# Patient Record
Sex: Female | Born: 1965 | Race: Black or African American | Hispanic: No | Marital: Single | Smoking: Never smoker
Health system: Southern US, Community
[De-identification: ages and names within clinical notes are randomized; demographics above are authoritative.]

## PROBLEM LIST (undated history)

## (undated) DIAGNOSIS — E039 Hypothyroidism, unspecified: Secondary | ICD-10-CM

---

## 1998-04-07 ENCOUNTER — Other Ambulatory Visit: Admission: RE | Admit: 1998-04-07 | Discharge: 1998-04-07 | Payer: Self-pay | Admitting: Obstetrics & Gynecology

## 1999-05-24 ENCOUNTER — Other Ambulatory Visit: Admission: RE | Admit: 1999-05-24 | Discharge: 1999-05-24 | Payer: Self-pay | Admitting: Obstetrics & Gynecology

## 1999-12-30 ENCOUNTER — Emergency Department (HOSPITAL_COMMUNITY): Admission: EM | Admit: 1999-12-30 | Discharge: 1999-12-30 | Payer: Self-pay | Admitting: Emergency Medicine

## 1999-12-30 ENCOUNTER — Encounter: Payer: Self-pay | Admitting: Emergency Medicine

## 2000-05-07 ENCOUNTER — Other Ambulatory Visit: Admission: RE | Admit: 2000-05-07 | Discharge: 2000-05-07 | Payer: Self-pay | Admitting: Obstetrics & Gynecology

## 2000-06-11 ENCOUNTER — Encounter (INDEPENDENT_AMBULATORY_CARE_PROVIDER_SITE_OTHER): Payer: Self-pay | Admitting: Specialist

## 2000-06-11 ENCOUNTER — Other Ambulatory Visit: Admission: RE | Admit: 2000-06-11 | Discharge: 2000-06-11 | Payer: Self-pay | Admitting: Obstetrics & Gynecology

## 2000-10-23 ENCOUNTER — Other Ambulatory Visit: Admission: RE | Admit: 2000-10-23 | Discharge: 2000-10-23 | Payer: Self-pay | Admitting: Obstetrics & Gynecology

## 2001-10-21 ENCOUNTER — Other Ambulatory Visit: Admission: RE | Admit: 2001-10-21 | Discharge: 2001-10-21 | Payer: Self-pay | Admitting: Obstetrics & Gynecology

## 2002-10-24 ENCOUNTER — Other Ambulatory Visit: Admission: RE | Admit: 2002-10-24 | Discharge: 2002-10-24 | Payer: Self-pay | Admitting: Obstetrics & Gynecology

## 2003-10-26 ENCOUNTER — Other Ambulatory Visit: Admission: RE | Admit: 2003-10-26 | Discharge: 2003-10-26 | Payer: Self-pay | Admitting: Obstetrics & Gynecology

## 2004-11-23 ENCOUNTER — Other Ambulatory Visit: Admission: RE | Admit: 2004-11-23 | Discharge: 2004-11-23 | Payer: Self-pay | Admitting: Obstetrics & Gynecology

## 2005-12-01 ENCOUNTER — Other Ambulatory Visit: Admission: RE | Admit: 2005-12-01 | Discharge: 2005-12-01 | Payer: Self-pay | Admitting: Obstetrics & Gynecology

## 2010-11-30 ENCOUNTER — Encounter
Admission: RE | Admit: 2010-11-30 | Discharge: 2010-11-30 | Payer: Self-pay | Source: Home / Self Care | Attending: Obstetrics & Gynecology | Admitting: Obstetrics & Gynecology

## 2010-12-14 ENCOUNTER — Encounter: Payer: Self-pay | Admitting: Obstetrics & Gynecology

## 2012-05-08 ENCOUNTER — Encounter (HOSPITAL_COMMUNITY): Payer: Self-pay | Admitting: Emergency Medicine

## 2012-05-08 ENCOUNTER — Emergency Department (HOSPITAL_COMMUNITY)
Admission: EM | Admit: 2012-05-08 | Discharge: 2012-05-08 | Disposition: A | Discharge status: Home / Self Care | Payer: BC Managed Care – PPO | Source: Home / Self Care | Attending: Emergency Medicine | Admitting: Emergency Medicine

## 2012-05-08 DIAGNOSIS — M6749 Ganglion, multiple sites: Secondary | ICD-10-CM

## 2012-05-08 MED ORDER — MELOXICAM 15 MG PO TABS: 15.0000 mg | ORAL_TABLET | Freq: Every day | ORAL | Status: AC

## 2012-05-08 NOTE — ED Provider Notes (Signed)
History     CSN: [id number]  Arrival date & time 05/08/12  1328   First MD Initiated Contact with Patient 05/08/12 1415      Chief Complaint  Patient presents with  . Foot Pain    (Consider location/radiation/quality/duration/timing/severity/associated sxs/prior treatment) HPI Comments: Patient states that she is wearing a tight pair shoes for several days, and states that both of her feet were hurting. States she has no wearing his, but notes a nontender, firm mass on the dorsum of the left foot. It is not changed in size since it appeared. No nausea, vomiting, fevers, redness. No recent or remote history of injury to her foot. Pain is worse with elevation for prolonged periods of time, and cold temperatures. No alleviating factors. She's been taking some Tylenol without improvement. She is nondiabetic or smoker.  ROS as noted in HPI. All other ROS negative.   Patient is a 46 y.o. female presenting with lower extremity pain. The history is provided by the patient.  Foot Pain This is a new problem. The current episode started more than 1 week ago. The problem has not changed since onset.Nothing aggravates the symptoms. Nothing relieves the symptoms. She has tried a cold compress for the symptoms.    History reviewed. No pertinent past medical history.  Past Surgical History  Procedure Date  . Cesarean section     History reviewed. No pertinent family history.  History  Substance Use Topics  . Smoking status: Never Smoker   . Smokeless tobacco: Not on file  . Alcohol Use: No    OB History    Grav Para Term Preterm Abortions TAB SAB Ect Mult Living                  Review of Systems  Allergies  Review of patient's allergies indicates no known allergies.  Home Medications   Current Outpatient Rx  Name Route Sig Dispense Refill  . ACETAMINOPHEN 500 MG PO TABS Oral Take 500 mg by mouth every 6 (six) hours as needed.    . MULTIVITAMINS PO CAPS Oral Take 1 capsule by  mouth daily.    Marland Kitchen OVER THE COUNTER MEDICATION  enzymes    . MELOXICAM 15 MG PO TABS Oral Take 1 tablet (15 mg total) by mouth daily. 14 tablet 0    BP 125/66  Pulse 88  Temp 98.5 F (36.9 C) (Oral)  Resp 20  SpO2 100%  LMP 04/11/2012  Physical Exam  Nursing note and vitals reviewed. Constitutional: She is oriented to person, place, and time. She appears well-developed and well-nourished. No distress.  HENT:  Head: Normocephalic and atraumatic.  Eyes: Conjunctivae and EOM are normal.  Neck: Normal range of motion.  Cardiovascular: Normal rate.   Pulmonary/Chest: Effort normal.  Abdominal: She exhibits no distension.  Musculoskeletal: Normal range of motion.       Left foot: She exhibits swelling. She exhibits no tenderness, no bony tenderness, normal capillary refill and no crepitus.       1.5 x 1.5 cm cm mobile nontender mass dorsum of foot c/w ganglion cyst   Neurological: She is alert and oriented to person, place, and time. Coordination normal.  Skin: Skin is warm and dry.  Psychiatric: She has a normal mood and affect. Her behavior is normal. Judgment and thought content normal.    ED Course  Procedures (including critical care time)  Labs Reviewed - No data to display No results found.   1. Other ganglion and cyst  of synovium, tendon, and bursa     MDM  H&P most consistent with a ganglion cyst on the dorsum of the left foot. No History of trauma, no bony tenderness, deferring x-ray. Think that patient would benefit more from ultrasound, CT, MRI.: home with Meloxicam. Referring to [facility], Dr. due to, orthopedist on call, or [facility] for followup as needed.  Luiz Blare, MD 05/08/12 [phone]

## 2012-05-08 NOTE — Discharge Instructions (Signed)
Take the medication as written. Take 1 gram of tylenol up to 4 times a day as needed for pain and fever. This with the meloxicam is an effective combination for pain. . Do not exceed 4 grams of tylenol a day from all sources. Return if you get worse, have a  fever >100.4, or for any concerns.   Go to www.goodrx.com to look up your medications. This will give you a list of where you can find your prescriptions at the most affordable prices.   

## 2012-05-08 NOTE — ED Notes (Signed)
Reports 2 week history of soreness to the top of left foot and has had swelling.  Patient reports aching when foot is elevated. Denies pain with weight bearing ambulation.  Bump feels firm, but able to move enlarged area with causing pain to the patient.  Pulses palpable.  Moves toes without difficulty.  Denies injury

## 2013-02-03 ENCOUNTER — Emergency Department (HOSPITAL_COMMUNITY)
Admission: EM | Admit: 2013-02-03 | Discharge: 2013-02-03 | Disposition: A | Discharge status: Home / Self Care | Payer: BC Managed Care – PPO | Source: Home / Self Care | Attending: Emergency Medicine | Admitting: Emergency Medicine

## 2013-02-03 ENCOUNTER — Encounter (HOSPITAL_COMMUNITY): Payer: Self-pay

## 2013-02-03 DIAGNOSIS — Z23 Encounter for immunization: Secondary | ICD-10-CM

## 2013-02-03 DIAGNOSIS — S0101XA Laceration without foreign body of scalp, initial encounter: Secondary | ICD-10-CM

## 2013-02-03 DIAGNOSIS — S0100XA Unspecified open wound of scalp, initial encounter: Secondary | ICD-10-CM

## 2013-02-03 MED ORDER — TETANUS-DIPHTH-ACELL PERTUSSIS 5-2.5-18.5 LF-MCG/0.5 IM SUSP
INTRAMUSCULAR | Status: AC
  Filled 2013-02-03: qty 0.5

## 2013-02-03 MED ORDER — TETANUS-DIPHTH-ACELL PERTUSSIS 5-2.5-18.5 LF-MCG/0.5 IM SUSP
0.5000 mL | Freq: Once | INTRAMUSCULAR | Status: AC
  Administered 2013-02-03: 0.5 mL via INTRAMUSCULAR

## 2013-02-03 MED ORDER — BACITRACIN ZINC 500 UNIT/GM EX OINT: TOPICAL_OINTMENT | Freq: Three times a day (TID) | CUTANEOUS | Status: AC

## 2013-02-03 NOTE — ED Notes (Signed)
Was asked to eval this patient on arrival by front office staff. Pt reportedly was struck by picture frame that fell from wall when she was laying on bed just PTA. Laceration aprox 2 cm, superficial , present, no bleeding at present, denies other injuries. Pressure dressing aplied on saclp until further care, pt to waiting area to wiat for bed placement

## 2013-02-03 NOTE — ED Provider Notes (Signed)
History     CSN: [id number]  Arrival date & time 02/03/13  1558   First MD Initiated Contact with Patient 02/03/13 1609      No chief complaint on file.   (Consider location/radiation/quality/duration/timing/severity/associated sxs/prior treatment) HPI Comments: Patient presents urgent care after having sustained a laceration to his cough. Patient reports she was struck by a picture frame fell down from a wall where she was lying down on her bed. Bled some from the scalp laceration that she applied ice where her bleeding subsequently stopped. Patient denies any loss of consciousness or severe or moderate headache. Mild soreness to the area of the infected wound. Denies any nausea vomiting dizziness or headache. She suspects she got a tetanus shot in the last 10 years her last  Patient is a 47 y.o. female presenting with head injury. The history is provided by the patient.  Head Injury Location:  R parietal Time since incident:  1 hour Pain details:    Quality:  Aching   Severity:  Mild   Timing:  Constant   Progression:  Unchanged Relieved by:  Ice Worsened by:  Nothing tried Associated symptoms: no blurred vision, no disorientation, no headaches, no loss of consciousness, no neck pain and no numbness   Risk factors: no alcohol use, not elderly and no occupational exposure     No past medical history on file.  Past Surgical History  Procedure Laterality Date  . Cesarean section      No family history on file.  History  Substance Use Topics  . Smoking status: Never Smoker   . Smokeless tobacco: Not on file  . Alcohol Use: No    OB History   Grav Para Term Preterm Abortions TAB SAB Ect Mult Living                  Review of Systems  Constitutional: Negative for chills, activity change and appetite change.  HENT: Negative for neck pain.   Eyes: Negative for blurred vision and visual disturbance.  Skin: Positive for wound. Negative for rash.  Neurological:  Negative for dizziness, loss of consciousness, numbness and headaches.    Allergies  Review of patient's allergies indicates no known allergies.  Home Medications   Current Outpatient Rx  Name  Route  Sig  Dispense  Refill  . acetaminophen (TYLENOL) 500 MG tablet   Oral   Take 500 mg by mouth every 6 (six) hours as needed.         . bacitracin ointment   Topical   Apply topically 3 (three) times daily.   120 g   0   . meloxicam (MOBIC) 15 MG tablet   Oral   Take 1 tablet (15 mg total) by mouth daily.   14 tablet   0   . Multiple Vitamin (MULTIVITAMIN) capsule   Oral   Take 1 capsule by mouth daily.         Marland Kitchen OVER THE COUNTER MEDICATION      enzymes           BP 145/62  Pulse 86  Temp(Src) 98.6 F (37 C) (Oral)  Resp 16  SpO2 98%  Physical Exam  Nursing note and vitals reviewed. Constitutional: She is oriented to person, place, and time. She appears well-developed and well-nourished.  HENT:  Head: Normocephalic.    Pulmonary/Chest: Effort normal and breath sounds normal.  Neurological: She is alert and oriented to person, place, and time.  Skin: No rash noted.  No erythema.    ED Course  Procedures (including critical care time)  Labs Reviewed - No data to display No results found.   1. Scalp laceration, initial encounter     Not candidate for sutures or staples- irrigated cleaned wound with hibiclens with topical abx application  MDM  Uncomplicated scalp wound- wound care - discussed symptoms or wound changes that will require of further evaluation        Jimmie Molly, MD 02/03/13 1739

## 2015-09-24 ENCOUNTER — Ambulatory Visit (INDEPENDENT_AMBULATORY_CARE_PROVIDER_SITE_OTHER): Payer: BLUE CROSS/BLUE SHIELD | Admitting: Family Medicine

## 2015-09-24 ENCOUNTER — Encounter: Payer: Self-pay | Admitting: Family Medicine

## 2015-09-24 VITALS — BP 114/80 | HR 90 | Ht 64.25 in | Wt 212.0 lb

## 2015-09-24 DIAGNOSIS — E669 Obesity, unspecified: Secondary | ICD-10-CM

## 2015-09-24 DIAGNOSIS — Z1239 Encounter for other screening for malignant neoplasm of breast: Secondary | ICD-10-CM

## 2015-09-24 DIAGNOSIS — Z7189 Other specified counseling: Secondary | ICD-10-CM

## 2015-09-24 DIAGNOSIS — Z1211 Encounter for screening for malignant neoplasm of colon: Secondary | ICD-10-CM | POA: Diagnosis not present

## 2015-09-24 DIAGNOSIS — Z7689 Persons encountering health services in other specified circumstances: Secondary | ICD-10-CM

## 2015-09-24 NOTE — Patient Instructions (Signed)
Vitamin D 800 or 1000 IU  [facility] will call you to set up colonoscopy.  I will put in order for mammogram. You call breast center to set up appointment.

## 2015-09-24 NOTE — Progress Notes (Signed)
   Subjective:    Patient ID: Brooke Rice, female    DOB: 11-Dec-1965, 49 y.o.   MRN: [id number]  HPI Chief Complaint  Patient presents with  . New Pt    no problems. just wanted to get established. declines flu shot.    She is new to the practice and here to establish primary care. In past she has been going to [facility] practice with Anna GenreStephanie Powers, NP Other providers: Dr. [facility]   PMH: No  Surgeries: C-section  LMP: November 2015, she thinks she has finally reached menopause.  Social history: denies smoking, alcohol, drug use. Happily divorced. One 6119 old child.  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   Vegetarian for past 5 months.  Exercises- treadmill and weight lifting  Colonoscopy- has not had one, would like referral  Mammogram- 2 years ago- requests referral, Breast center in past.  Pap- last one in 2013 and normal per patient  Flu shot- has never gotten one and refuses.  Tdap- UTD 2014   Reviewed allergies, medications, past medical, surgical, social history.  Review of Systems Pertinent positives and negatives in the history of present illness.    Objective:   Physical Exam  Alert and oriented and in no distress. Not otherwise examined. BP 114/80 mmHg  Pulse 90  Ht 5' 4.25" (1.632 m)  Wt 212 lb (96.163 kg)  BMI 36.11 kg/m2  SpO2 98%  LMP 04/11/2012     Assessment & Plan:  Encounter to establish care  Obesity (BMI 30-39.9)  Special screening for malignant neoplasms, colon - Plan: Ambulatory referral to Gastroenterology  Screening for breast cancer - Plan: MM DIGITAL SCREENING BILATERAL  Congratulated her on eating healthy and exercising. Discussed she should get the recommended 150 minutes of physical activity per week. Encouraged her to continue on weight loss journey. Will refer her for colonoscopy and mammogram. Discussed that since she has had normal pap smears in past that we can do her future paps here and then send her to GYN if  needed. She will return as needed and schedule for physical and fasting labs when she is due. Need to get medical records. Refused flu shot, even though I highly recommended it.

## 2015-12-14 ENCOUNTER — Other Ambulatory Visit: Payer: Self-pay | Admitting: Family Medicine

## 2015-12-14 DIAGNOSIS — Z1231 Encounter for screening mammogram for malignant neoplasm of breast: Secondary | ICD-10-CM

## 2016-03-24 DIAGNOSIS — R0602 Shortness of breath: Secondary | ICD-10-CM | POA: Diagnosis not present

## 2016-03-24 DIAGNOSIS — J018 Other acute sinusitis: Secondary | ICD-10-CM | POA: Diagnosis not present

## 2016-03-28 DIAGNOSIS — R748 Abnormal levels of other serum enzymes: Secondary | ICD-10-CM | POA: Diagnosis not present

## 2016-03-28 DIAGNOSIS — Z1211 Encounter for screening for malignant neoplasm of colon: Secondary | ICD-10-CM | POA: Diagnosis not present

## 2016-04-12 DIAGNOSIS — B029 Zoster without complications: Secondary | ICD-10-CM | POA: Diagnosis not present

## 2016-05-26 DIAGNOSIS — K6389 Other specified diseases of intestine: Secondary | ICD-10-CM | POA: Diagnosis not present

## 2016-05-26 DIAGNOSIS — D124 Benign neoplasm of descending colon: Secondary | ICD-10-CM | POA: Diagnosis not present

## 2016-05-26 DIAGNOSIS — Z1211 Encounter for screening for malignant neoplasm of colon: Secondary | ICD-10-CM | POA: Diagnosis not present

## 2016-05-26 DIAGNOSIS — K635 Polyp of colon: Secondary | ICD-10-CM | POA: Diagnosis not present

## 2016-06-06 DIAGNOSIS — Z0000 Encounter for general adult medical examination without abnormal findings: Secondary | ICD-10-CM | POA: Diagnosis not present

## 2016-06-06 DIAGNOSIS — Z1211 Encounter for screening for malignant neoplasm of colon: Secondary | ICD-10-CM | POA: Diagnosis not present

## 2016-06-06 DIAGNOSIS — Z6835 Body mass index (BMI) 35.0-35.9, adult: Secondary | ICD-10-CM | POA: Diagnosis not present

## 2016-11-16 ENCOUNTER — Encounter (HOSPITAL_COMMUNITY): Payer: Self-pay | Admitting: Emergency Medicine

## 2016-11-16 ENCOUNTER — Emergency Department (HOSPITAL_COMMUNITY)
Admission: EM | Admit: 2016-11-16 | Discharge: 2016-11-16 | Disposition: A | Discharge status: Home / Self Care | Payer: Self-pay | Attending: Emergency Medicine | Admitting: Emergency Medicine

## 2016-11-16 DIAGNOSIS — Z79899 Other long term (current) drug therapy: Secondary | ICD-10-CM | POA: Insufficient documentation

## 2016-11-16 DIAGNOSIS — R404 Transient alteration of awareness: Secondary | ICD-10-CM | POA: Diagnosis not present

## 2016-11-16 DIAGNOSIS — R42 Dizziness and giddiness: Secondary | ICD-10-CM | POA: Insufficient documentation

## 2016-11-16 DIAGNOSIS — Z5321 Procedure and treatment not carried out due to patient leaving prior to being seen by health care provider: Secondary | ICD-10-CM | POA: Insufficient documentation

## 2016-11-16 LAB — CBC
HEMATOCRIT: 37.6 % (ref 36.0–46.0)
HEMOGLOBIN: 12.4 g/dL (ref 12.0–15.0)
MCH: 30.9 pg (ref 26.0–34.0)
MCHC: 33 g/dL (ref 30.0–36.0)
MCV: 93.8 fL (ref 78.0–100.0)
Platelets: 262 10*3/uL (ref 150–400)
RBC: 4.01 MIL/uL (ref 3.87–5.11)
RDW: 12.2 % (ref 11.5–15.5)
WBC: 7.8 10*3/uL (ref 4.0–10.5)

## 2016-11-16 LAB — BASIC METABOLIC PANEL
ANION GAP: 7 (ref 5–15)
BUN: 9 mg/dL (ref 6–20)
CHLORIDE: 107 mmol/L (ref 101–111)
CO2: 25 mmol/L (ref 22–32)
Calcium: 9.3 mg/dL (ref 8.9–10.3)
Creatinine, Ser: 0.89 mg/dL (ref 0.44–1.00)
GFR calc Af Amer: >60 mL/min (ref >60)
GFR calc non Af Amer: >60 mL/min (ref >60)
Glucose, Bld: 119 mg/dL — ABNORMAL HIGH (ref 65–99)
POTASSIUM: 4 mmol/L (ref 3.5–5.1)
SODIUM: 139 mmol/L (ref 135–145)

## 2016-11-16 NOTE — ED Triage Notes (Addendum)
Per EMS: pt became dizzy while driving after shopping today; pt sts things were spinning around; improved at present; IV 20g L hand; pt sts right sided HA x 2 weeks

## 2016-11-16 NOTE — ED Notes (Signed)
Pt to nurse first desk reporting she is wanting to leave. IV removed. Pt ambulatory, a&o X4. No distress noted.

## 2017-02-28 DIAGNOSIS — Z6835 Body mass index (BMI) 35.0-35.9, adult: Secondary | ICD-10-CM | POA: Diagnosis not present

## 2017-02-28 DIAGNOSIS — Z01419 Encounter for gynecological examination (general) (routine) without abnormal findings: Secondary | ICD-10-CM | POA: Diagnosis not present

## 2017-02-28 DIAGNOSIS — Z1231 Encounter for screening mammogram for malignant neoplasm of breast: Secondary | ICD-10-CM | POA: Diagnosis not present

## 2017-12-24 DIAGNOSIS — H5213 Myopia, bilateral: Secondary | ICD-10-CM | POA: Diagnosis not present

## 2018-01-21 DIAGNOSIS — J069 Acute upper respiratory infection, unspecified: Secondary | ICD-10-CM | POA: Diagnosis not present

## 2018-01-21 DIAGNOSIS — Z0000 Encounter for general adult medical examination without abnormal findings: Secondary | ICD-10-CM | POA: Diagnosis not present

## 2018-01-21 DIAGNOSIS — Z1231 Encounter for screening mammogram for malignant neoplasm of breast: Secondary | ICD-10-CM | POA: Diagnosis not present

## 2018-01-21 DIAGNOSIS — R42 Dizziness and giddiness: Secondary | ICD-10-CM | POA: Diagnosis not present

## 2018-11-18 ENCOUNTER — Other Ambulatory Visit: Payer: Self-pay | Admitting: Internal Medicine

## 2018-11-18 DIAGNOSIS — R42 Dizziness and giddiness: Secondary | ICD-10-CM | POA: Diagnosis not present

## 2018-11-18 DIAGNOSIS — R29898 Other symptoms and signs involving the musculoskeletal system: Secondary | ICD-10-CM | POA: Diagnosis not present

## 2018-11-18 DIAGNOSIS — Z1231 Encounter for screening mammogram for malignant neoplasm of breast: Secondary | ICD-10-CM

## 2018-11-18 DIAGNOSIS — Z1239 Encounter for other screening for malignant neoplasm of breast: Secondary | ICD-10-CM | POA: Diagnosis not present

## 2018-11-19 ENCOUNTER — Ambulatory Visit
Admission: RE | Admit: 2018-11-19 | Discharge: 2018-11-19 | Disposition: A | Discharge status: Home / Self Care | Payer: BLUE CROSS/BLUE SHIELD | Source: Ambulatory Visit | Attending: Internal Medicine | Admitting: Internal Medicine

## 2018-11-19 DIAGNOSIS — I6522 Occlusion and stenosis of left carotid artery: Secondary | ICD-10-CM | POA: Diagnosis not present

## 2018-11-19 DIAGNOSIS — R42 Dizziness and giddiness: Secondary | ICD-10-CM

## 2018-11-22 ENCOUNTER — Ambulatory Visit
Admission: RE | Admit: 2018-11-22 | Discharge: 2018-11-22 | Disposition: A | Discharge status: Home / Self Care | Payer: BLUE CROSS/BLUE SHIELD | Source: Ambulatory Visit | Attending: Internal Medicine | Admitting: Internal Medicine

## 2018-11-22 DIAGNOSIS — Z1231 Encounter for screening mammogram for malignant neoplasm of breast: Secondary | ICD-10-CM

## 2019-01-23 DIAGNOSIS — Z1211 Encounter for screening for malignant neoplasm of colon: Secondary | ICD-10-CM | POA: Diagnosis not present

## 2019-01-23 DIAGNOSIS — Z0000 Encounter for general adult medical examination without abnormal findings: Secondary | ICD-10-CM | POA: Diagnosis not present

## 2019-01-23 DIAGNOSIS — Z1239 Encounter for other screening for malignant neoplasm of breast: Secondary | ICD-10-CM | POA: Diagnosis not present

## 2019-01-23 DIAGNOSIS — E669 Obesity, unspecified: Secondary | ICD-10-CM | POA: Diagnosis not present

## 2020-05-28 ENCOUNTER — Other Ambulatory Visit: Payer: Self-pay | Admitting: Internal Medicine

## 2020-05-28 DIAGNOSIS — Z1231 Encounter for screening mammogram for malignant neoplasm of breast: Secondary | ICD-10-CM

## 2020-06-30 ENCOUNTER — Other Ambulatory Visit: Payer: Self-pay

## 2020-06-30 ENCOUNTER — Ambulatory Visit
Admission: RE | Admit: 2020-06-30 | Discharge: 2020-06-30 | Disposition: A | Discharge status: Home / Self Care | Payer: BLUE CROSS/BLUE SHIELD | Source: Ambulatory Visit | Attending: Internal Medicine | Admitting: Internal Medicine

## 2020-06-30 DIAGNOSIS — Z1231 Encounter for screening mammogram for malignant neoplasm of breast: Secondary | ICD-10-CM

## 2020-12-08 ENCOUNTER — Ambulatory Visit (HOSPITAL_COMMUNITY)
Admission: EM | Admit: 2020-12-08 | Discharge: 2020-12-08 | Disposition: A | Discharge status: Home / Self Care | Payer: Managed Care, Other (non HMO)

## 2020-12-08 ENCOUNTER — Encounter (HOSPITAL_COMMUNITY): Payer: Self-pay

## 2020-12-08 ENCOUNTER — Other Ambulatory Visit: Payer: Self-pay

## 2020-12-08 DIAGNOSIS — U071 COVID-19: Secondary | ICD-10-CM

## 2020-12-08 DIAGNOSIS — R059 Cough, unspecified: Secondary | ICD-10-CM | POA: Diagnosis not present

## 2020-12-08 NOTE — ED Triage Notes (Signed)
Pt presents for note to return to work on Friday 12/10/2020 after recovering from covid; pt tested positive on 11/30/2020 , pt states she is no longer symptomatic.

## 2020-12-08 NOTE — ED Provider Notes (Addendum)
[facility]    CSN: [id number] Arrival date & time: 12/08/20  0818      History   Chief Complaint Chief Complaint  Patient presents with   Work Note Post Covid     HPI Brooke Rice is a 55 y.o. female.   HPI   COVID-19: According to patient she tested positive for COVID-19 on 11/30/2020.  She presents today to ensure that she is safe to return to work with an estimated return to work date of Friday, 12/10/2020.  She states that symptoms included headache, cough, mild nausea and vomiting, mild diarrhea, and a fever with a T-max of 102 Fahrenheit.  She states all of her symptoms have resolved and she is feeling well. NO fever within 24 hours. She denies any chest pain, shortness of breath, fatigue.  She does state that her blood pressure is likely a little bit elevated today as her mom was hospitalized for COVID-19.  Patient has not been vaccinated against COVID-19 but asks about when she should consider this in the future.   History reviewed. No pertinent past medical history.  There are no problems to display for this patient.   Past Surgical History:  Procedure Laterality Date   CESAREAN SECTION      OB History   No obstetric history on file.      Home Medications    Prior to Admission medications   Medication Sig Start Date End Date Taking? Authorizing Provider  acetaminophen (TYLENOL) 500 MG tablet Take 500 mg by mouth every 6 (six) hours as needed.    [provider]  Multiple Vitamin (MULTIVITAMIN) capsule Take 1 capsule by mouth daily.    [provider]  OVER THE COUNTER MEDICATION enzymes    [provider]    Family History Family History  Problem Relation Age of Onset   Breast cancer Neg Hx     Social History Social History   Tobacco Use   Smoking status: Never Smoker  Substance Use Topics   Alcohol use: No   Drug use: No     Allergies   Patient has no known allergies.   Review of  Systems Review of Systems  See above in HPI Physical Exam Triage Vital Signs ED Triage Vitals  Enc Vitals Group     BP 12/08/20 0853 (!) 146/72     Pulse Rate 12/08/20 0853 100     Resp 12/08/20 0853 18     Temp 12/08/20 0853 98.4 F (36.9 C)     Temp Source 12/08/20 0853 Oral     SpO2 12/08/20 0853 99 %     Weight --      Height --      Head Circumference --      Peak Flow --      Pain Score 12/08/20 0852 0     Pain Loc --      Pain Edu? --      Excl. in GC? --    No data found.  Updated Vital Signs BP (!) 146/72 (BP Location: Right Arm)    Pulse 100    Temp 98.4 F (36.9 C) (Oral)    Resp 18    LMP 04/11/2012    SpO2 99%   Physical Exam Vitals and nursing note reviewed.  Constitutional:      General: She is not in acute distress.    Appearance: Normal appearance. She is not ill-appearing, toxic-appearing or diaphoretic.  HENT:     Head:  Normocephalic.     Nose: Nose normal. No congestion or rhinorrhea.     Mouth/Throat:     Mouth: Mucous membranes are moist.     Pharynx: No oropharyngeal exudate or posterior oropharyngeal erythema.  Eyes:     Conjunctiva/sclera: Conjunctivae normal.     Pupils: Pupils are equal, round, and reactive to light.  Cardiovascular:     Rate and Rhythm: Normal rate and regular rhythm.     Pulses: Normal pulses.     Heart sounds: Normal heart sounds. No murmur heard. No gallop.   Pulmonary:     Effort: Pulmonary effort is normal. No respiratory distress.     Breath sounds: Normal breath sounds. No stridor. No wheezing, rhonchi or rales.  Chest:     Chest wall: No tenderness.  Musculoskeletal:     Cervical back: Normal range of motion and neck supple.  Lymphadenopathy:     Cervical: No cervical adenopathy.  Neurological:     Mental Status: She is alert.      UC Treatments / Results  Labs (all labs ordered are listed, but only abnormal results are displayed) Labs Reviewed - No data to display  Radiology No results  found.  Procedures Procedures (including critical care time)  Medications Ordered in UC Medications - No data to display  Initial Impression / Assessment and Plan / UC Course  I have reviewed the triage vital signs and the nursing notes.  Pertinent labs & imaging results that were available during my care of the patient were reviewed by me and considered in my medical decision making (see chart for details).     Patient appears to be resolving well from her illness.  We discussed red flag symptoms and the importance of continuing to rest and hydrate along with a healthy balanced diet.  She is cleared to return to work at this time.  We also discussed the COVID-19 vaccination including the recommendation for her to start her vaccination program in 1 month from her COVID-19 illness.   Final Clinical Impressions(s) / UC Diagnoses   Final diagnoses:  None   Discharge Instructions   None    ED Prescriptions    None     PDMP not reviewed this encounter.   Rushie Chestnut, PA-C 12/08/20 0951    Rushie Chestnut, PA-C 12/08/20 [phone]

## 2021-01-28 ENCOUNTER — Emergency Department (HOSPITAL_COMMUNITY): Payer: Managed Care, Other (non HMO)

## 2021-01-28 ENCOUNTER — Emergency Department (HOSPITAL_COMMUNITY)
Admission: EM | Admit: 2021-01-28 | Discharge: 2021-01-28 | Disposition: A | Discharge status: Home / Self Care | Payer: Managed Care, Other (non HMO) | Attending: Emergency Medicine | Admitting: Emergency Medicine

## 2021-01-28 ENCOUNTER — Encounter (HOSPITAL_COMMUNITY): Payer: Self-pay | Admitting: Emergency Medicine

## 2021-01-28 DIAGNOSIS — R519 Headache, unspecified: Secondary | ICD-10-CM | POA: Insufficient documentation

## 2021-01-28 DIAGNOSIS — R42 Dizziness and giddiness: Secondary | ICD-10-CM | POA: Insufficient documentation

## 2021-01-28 DIAGNOSIS — I10 Essential (primary) hypertension: Secondary | ICD-10-CM | POA: Insufficient documentation

## 2021-01-28 DIAGNOSIS — R079 Chest pain, unspecified: Secondary | ICD-10-CM | POA: Insufficient documentation

## 2021-01-28 DIAGNOSIS — Z5321 Procedure and treatment not carried out due to patient leaving prior to being seen by health care provider: Secondary | ICD-10-CM | POA: Diagnosis not present

## 2021-01-28 LAB — CBC
HCT: 37.5 % (ref 36.0–46.0)
Hemoglobin: 12.2 g/dL (ref 12.0–15.0)
MCH: 31.3 pg (ref 26.0–34.0)
MCHC: 32.5 g/dL (ref 30.0–36.0)
MCV: 96.2 fL (ref 80.0–100.0)
Platelets: 252 10*3/uL (ref 150–400)
RBC: 3.9 MIL/uL (ref 3.87–5.11)
RDW: 12.6 % (ref 11.5–15.5)
WBC: 9.2 10*3/uL (ref 4.0–10.5)
nRBC: 0 % (ref 0.0–0.2)

## 2021-01-28 LAB — BASIC METABOLIC PANEL
Anion gap: 7 (ref 5–15)
BUN: 9 mg/dL (ref 6–20)
CO2: 25 mmol/L (ref 22–32)
Calcium: 9.2 mg/dL (ref 8.9–10.3)
Chloride: 103 mmol/L (ref 98–111)
Creatinine, Ser: 0.93 mg/dL (ref 0.44–1.00)
GFR, Estimated: >60 mL/min (ref >60)
Glucose, Bld: 105 mg/dL — ABNORMAL HIGH (ref 70–99)
Potassium: 3.9 mmol/L (ref 3.5–5.1)
Sodium: 135 mmol/L (ref 135–145)

## 2021-01-28 LAB — TROPONIN I (HIGH SENSITIVITY): Troponin I (High Sensitivity): 4 ng/L (ref <18)

## 2021-01-28 NOTE — ED Triage Notes (Signed)
Pt. Stated, I started having a headache and some chest pain this morning around 1100am . I went to [facility] and took my BP and it was 141.. All of my symptoms have gone away. I just wanted to get checked out.

## 2021-01-28 NOTE — ED Triage Notes (Signed)
EMS stated, here for dizziness and hypertension. Having dizziness and headache started today.

## 2021-01-28 NOTE — ED Notes (Signed)
Called pts name 3x for updated VS with no response.  

## 2021-01-29 ENCOUNTER — Encounter (HOSPITAL_COMMUNITY): Payer: Self-pay | Admitting: Emergency Medicine

## 2021-01-29 ENCOUNTER — Other Ambulatory Visit: Payer: Self-pay

## 2021-01-29 ENCOUNTER — Emergency Department (HOSPITAL_COMMUNITY)
Admission: EM | Admit: 2021-01-29 | Discharge: 2021-01-29 | Disposition: A | Discharge status: Home / Self Care | Payer: Managed Care, Other (non HMO) | Attending: Emergency Medicine | Admitting: Emergency Medicine

## 2021-01-29 DIAGNOSIS — E875 Hyperkalemia: Secondary | ICD-10-CM | POA: Insufficient documentation

## 2021-01-29 DIAGNOSIS — I10 Essential (primary) hypertension: Secondary | ICD-10-CM | POA: Insufficient documentation

## 2021-01-29 DIAGNOSIS — R030 Elevated blood-pressure reading, without diagnosis of hypertension: Secondary | ICD-10-CM | POA: Diagnosis present

## 2021-01-29 LAB — I-STAT CHEM 8, ED
BUN: 10 mg/dL (ref 6–20)
Calcium, Ion: 1.14 mmol/L — ABNORMAL LOW (ref 1.15–1.40)
Chloride: 107 mmol/L (ref 98–111)
Creatinine, Ser: 0.7 mg/dL (ref 0.44–1.00)
Glucose, Bld: 94 mg/dL (ref 70–99)
HCT: 36 % (ref 36.0–46.0)
Hemoglobin: 12.2 g/dL (ref 12.0–15.0)
Potassium: 5.8 mmol/L — ABNORMAL HIGH (ref 3.5–5.1)
Sodium: 138 mmol/L (ref 135–145)
TCO2: 27 mmol/L (ref 22–32)

## 2021-01-29 LAB — CBC WITH DIFFERENTIAL/PLATELET
Abs Immature Granulocytes: 0.03 10*3/uL (ref 0.00–0.07)
Basophils Absolute: 0 10*3/uL (ref 0.0–0.1)
Basophils Relative: 1 %
Eosinophils Absolute: 0.1 10*3/uL (ref 0.0–0.5)
Eosinophils Relative: 1 %
HCT: 38 % (ref 36.0–46.0)
Hemoglobin: 12.3 g/dL (ref 12.0–15.0)
Immature Granulocytes: 0 %
Lymphocytes Relative: 18 %
Lymphs Abs: 1.6 10*3/uL (ref 0.7–4.0)
MCH: 31.2 pg (ref 26.0–34.0)
MCHC: 32.4 g/dL (ref 30.0–36.0)
MCV: 96.4 fL (ref 80.0–100.0)
Monocytes Absolute: 0.6 10*3/uL (ref 0.1–1.0)
Monocytes Relative: 7 %
Neutro Abs: 6.4 10*3/uL (ref 1.7–7.7)
Neutrophils Relative %: 73 %
Platelets: 251 10*3/uL (ref 150–400)
RBC: 3.94 MIL/uL (ref 3.87–5.11)
RDW: 12.4 % (ref 11.5–15.5)
WBC: 8.8 10*3/uL (ref 4.0–10.5)
nRBC: 0 % (ref 0.0–0.2)

## 2021-01-29 MED ORDER — SODIUM ZIRCONIUM CYCLOSILICATE 10 G PO PACK
10.0000 g | PACK | Freq: Once | ORAL | Status: AC
  Administered 2021-01-29: 10 g via ORAL
  Filled 2021-01-29: qty 1

## 2021-01-29 NOTE — ED Triage Notes (Signed)
Pt states she was seen in ED yesterday and LWBS because she was feeling better.  Reports BP normal at home this morning but elevated when she went to the store to check it.  Reports mild R arm tingling.  States it feels like she slept on arm.  No arm drift or weakness.  No neuro deficits noted.

## 2021-01-29 NOTE — Discharge Instructions (Addendum)
You have been evaluated for your symptoms.  Your blood pressure is mildly elevated, please have that rechecked by your PCP next week.  Furthermore, your potassium level is elevated today at 5.8.  You are giving a medication to help reduce the potassium level but this will also need to be rechecked by your PCP next week.

## 2021-01-29 NOTE — ED Provider Notes (Signed)
[facility] EMERGENCY DEPARTMENT Provider Note   CSN: [id number] Arrival date & time: 01/29/21  0947     History Chief Complaint  Patient presents with  . Hypertension    Brooke Rice is a 55 y.o. female.  The history is provided by the patient. No language interpreter was used.  Hypertension     55 year old female presenting for evaluation of elevated blood pressure.  Patient report yesterday she was driving home when she experienced any bouts of dizziness in which she described more as a room spinning sensation with some heart palpitation.  She was concerned, pulled over, and contacted EMS.  When EMS arrived, it was noted that her blood pressure was elevated in the 180s.  She was brought to the ER and while waiting blood pressure improved to 140s.  She decides to leave before being evaluated, this morning she woke up and check her blood pressure and it was at 140 systolic.  She went to [facility] to get grocery and while then she check her blood pressure at [facility] machine and it was reading in the 160s.  She decided to come to the ER just to check.  She denies any associated headache, focal numbness or focal weakness, chest pain, trouble breathing, abdominal pain, nausea, diaphoresis.  She denies tobacco or alcohol use.  She does admits that she has been bad with her diet eating more salty food as well as having some increased stress taking care of her mom.  She does have a PCP.  She does not take any medication on regular basis.  History reviewed. No pertinent past medical history.  There are no problems to display for this patient.   Past Surgical History:  Procedure Laterality Date  . CESAREAN SECTION       OB History   No obstetric history on file.     Family History  Problem Relation Age of Onset  . Breast cancer Neg Hx     Social History   Tobacco Use  . Smoking status: Never Smoker  . Smokeless tobacco: Never Used  Substance Use Topics  .  Alcohol use: No  . Drug use: No    Home Medications Prior to Admission medications   Medication Sig Start Date End Date Taking? Authorizing Provider  acetaminophen (TYLENOL) 500 MG tablet Take 500 mg by mouth every 6 (six) hours as needed.    [provider]  Multiple Vitamin (MULTIVITAMIN) capsule Take 1 capsule by mouth daily.    [provider]  OVER THE COUNTER MEDICATION enzymes    [provider]    Allergies    Patient has no known allergies.  Review of Systems   Review of Systems  All other systems reviewed and are negative.   Physical Exam Updated Vital Signs BP (!) 142/77 (BP Location: Left Arm)   Pulse 95   Temp 98.4 F (36.9 C) (Oral)   Resp 16   LMP 04/11/2012   SpO2 98%   Physical Exam Vitals and nursing note reviewed.  Constitutional:      General: She is not in acute distress.    Appearance: She is well-developed. She is obese.  HENT:     Head: Atraumatic.  Eyes:     Conjunctiva/sclera: Conjunctivae normal.  Cardiovascular:     Rate and Rhythm: Normal rate and regular rhythm.     Pulses: Normal pulses.     Heart sounds: Normal heart sounds.  Pulmonary:     Effort: Pulmonary effort  is normal.     Breath sounds: Normal breath sounds.  Abdominal:     Palpations: Abdomen is soft.     Tenderness: There is no abdominal tenderness.  Musculoskeletal:     Cervical back: Neck supple.     Right lower leg: No edema.     Left lower leg: No edema.  Skin:    Findings: No rash.  Neurological:     Mental Status: She is alert and oriented to person, place, and time.     GCS: GCS eye subscore is 4. GCS verbal subscore is 5. GCS motor subscore is 6.     Cranial Nerves: Cranial nerves are intact.     Sensory: Sensation is intact.     Motor: Motor function is intact.     ED Results / Procedures / Treatments   Labs (all labs ordered are listed, but only abnormal results are displayed) Labs Reviewed  I-STAT CHEM 8, ED - Abnormal;  Notable for the following components:      Result Value   Potassium 5.8 (*)    Calcium, Ion 1.14 (*)    All other components within normal limits  CBC WITH DIFFERENTIAL/PLATELET    EKG EKG Interpretation  Date/Time:  Saturday January 29 2021 13:51:23 EDT Ventricular Rate:  86 PR Interval:    QRS Duration: 79 QT Interval:  352 QTC Calculation: 421 R Axis:   42 Text Interpretation: Sinus rhythm normal, no change from previous Confirmed by Arby Barrette [phone]) on 01/29/2021 1:54:01 PM   Radiology DG Chest 2 View  Result Date: 01/28/2021 CLINICAL DATA:  Chest pain EXAM: CHEST - 2 VIEW COMPARISON:  None. FINDINGS: The heart size and mediastinal contours are within normal limits. Both lungs are clear. The visualized skeletal structures are unremarkable. IMPRESSION: No active cardiopulmonary disease. Electronically Signed   By: Charlett Nose M.D.   On: 01/28/2021 19:57    Procedures Procedures   Medications Ordered in ED Medications  sodium zirconium cyclosilicate (LOKELMA) packet 10 g (10 g Oral Given 01/29/21 1544)    ED Course  I have reviewed the triage vital signs and the nursing notes.  Pertinent labs & imaging results that were available during my care of the patient were reviewed by me and considered in my medical decision making (see chart for details).    MDM Rules/Calculators/A&P                          BP 137/74 (BP Location: Right Arm)   Pulse 73   Temp 98.4 F (36.9 C) (Oral)   Resp 13   LMP 04/11/2012   SpO2 97%   Final Clinical Impression(s) / ED Diagnoses Final diagnoses:  Hypertension, unspecified type  Hyperkalemia    Rx / DC Orders ED Discharge Orders    None     1:49 PM Patient reported intermittent bouts of dizziness and elevated blood pressure since yesterday.  Otherwise no other concern.  At this time her blood pressure is 142/77.  She does not have any significant cardiac history.  She does admits to not eating healthy and have been  increasing her salt intake.  Will obtain EKG, screening labs but anticipate discharging home for follow-up with her PCP if evaluation unremarkable.  3:30 PM Lab is remarkable for an elevated potassium of 5.8 however no EKG changes.  Patient without any chest pain and no history of diabetes or renal disease.  We will give a dose of Lokelma  here, recommend patient to have a potassium level rechecked next week but otherwise she is stable for discharge.  Return precaution given.   Fayrene Helper, PA-C 02/03/21 1310    Arby Barrette, MD 02/04/21 [phone]

## 2021-02-16 ENCOUNTER — Ambulatory Visit (HOSPITAL_COMMUNITY)
Admission: EM | Admit: 2021-02-16 | Discharge: 2021-02-16 | Disposition: A | Discharge status: Home / Self Care | Payer: Managed Care, Other (non HMO) | Attending: Family Medicine | Admitting: Family Medicine

## 2021-02-16 ENCOUNTER — Encounter (HOSPITAL_COMMUNITY): Payer: Self-pay | Admitting: Emergency Medicine

## 2021-02-16 ENCOUNTER — Other Ambulatory Visit: Payer: Self-pay

## 2021-02-16 DIAGNOSIS — R42 Dizziness and giddiness: Secondary | ICD-10-CM

## 2021-02-16 DIAGNOSIS — I10 Essential (primary) hypertension: Secondary | ICD-10-CM

## 2021-02-16 LAB — CBC WITH DIFFERENTIAL/PLATELET
Abs Immature Granulocytes: 0.02 10*3/uL (ref 0.00–0.07)
Basophils Absolute: 0 10*3/uL (ref 0.0–0.1)
Basophils Relative: 1 %
Eosinophils Absolute: 0.1 10*3/uL (ref 0.0–0.5)
Eosinophils Relative: 1 %
HCT: 36.5 % (ref 36.0–46.0)
Hemoglobin: 12.2 g/dL (ref 12.0–15.0)
Immature Granulocytes: 0 %
Lymphocytes Relative: 20 %
Lymphs Abs: 1.8 10*3/uL (ref 0.7–4.0)
MCH: 32 pg (ref 26.0–34.0)
MCHC: 33.4 g/dL (ref 30.0–36.0)
MCV: 95.8 fL (ref 80.0–100.0)
Monocytes Absolute: 0.6 10*3/uL (ref 0.1–1.0)
Monocytes Relative: 7 %
Neutro Abs: 6.3 10*3/uL (ref 1.7–7.7)
Neutrophils Relative %: 71 %
Platelets: 198 10*3/uL (ref 150–400)
RBC: 3.81 MIL/uL — ABNORMAL LOW (ref 3.87–5.11)
RDW: 12.4 % (ref 11.5–15.5)
WBC: 8.8 10*3/uL (ref 4.0–10.5)
nRBC: 0 % (ref 0.0–0.2)

## 2021-02-16 LAB — COMPREHENSIVE METABOLIC PANEL
ALT: 22 U/L (ref 0–44)
AST: 24 U/L (ref 15–41)
Albumin: 3.8 g/dL (ref 3.5–5.0)
Alkaline Phosphatase: 77 U/L (ref 38–126)
Anion gap: 7 (ref 5–15)
BUN: 10 mg/dL (ref 6–20)
CO2: 26 mmol/L (ref 22–32)
Calcium: 9.3 mg/dL (ref 8.9–10.3)
Chloride: 106 mmol/L (ref 98–111)
Creatinine, Ser: 0.81 mg/dL (ref 0.44–1.00)
GFR, Estimated: >60 mL/min (ref >60)
Glucose, Bld: 88 mg/dL (ref 70–99)
Potassium: 3.6 mmol/L (ref 3.5–5.1)
Sodium: 139 mmol/L (ref 135–145)
Total Bilirubin: 0.8 mg/dL (ref 0.3–1.2)
Total Protein: 7.3 g/dL (ref 6.5–8.1)

## 2021-02-16 LAB — TSH: TSH: 5.025 u[IU]/mL — ABNORMAL HIGH (ref 0.350–4.500)

## 2021-02-16 NOTE — ED Triage Notes (Signed)
Pt presents with dizziness and feeling off balance. States was seen at ED on 01/28/21 with low potassium and followed with PCP on 3/25. States was started on Iron supplements and told to follow-up in 6 months.

## 2021-02-19 NOTE — ED Provider Notes (Signed)
[facility]    CSN: [id number] Arrival date & time: 02/16/21  1744      History   Chief Complaint Chief Complaint  Patient presents with  . Dizziness  . Hypertension    HPI Brooke Rice is a 55 y.o. female.   Patient presenting today with several weeks of ongoing of dizziness and feeling off balance.  She denies any chest pain, shortness of breath, visual changes, syncope, extremity numbness tingling or weakness, fever, recent illness associated.  She has now been seen by her PCP and the ED for this in the past few days with only abnormality being found is mildly abnormal potassium and low hemoglobin.  Potassium was corrected in the ED and her PCP started her on iron supplements over-the-counter which she has been taking faithfully.  Has been checking her home blood pressures and they have been running in the 130s over 70s pretty consistently.Not trying anything new over-the-counter for symptoms.     History reviewed. No pertinent past medical history.  There are no problems to display for this patient.   Past Surgical History:  Procedure Laterality Date  . CESAREAN SECTION      OB History   No obstetric history on file.      Home Medications    Prior to Admission medications   Medication Sig Start Date End Date Taking? Authorizing Provider  Ferrous Sulfate (IRON) 142 (45 Fe) MG TBCR Take by mouth.   Yes [provider]  acetaminophen (TYLENOL) 500 MG tablet Take 500 mg by mouth every 6 (six) hours as needed.    [provider]  Multiple Vitamin (MULTIVITAMIN) capsule Take 1 capsule by mouth daily.    [provider]  OVER THE COUNTER MEDICATION enzymes    [provider]    Family History Family History  Problem Relation Age of Onset  . Breast cancer Neg Hx     Social History Social History   Tobacco Use  . Smoking status: Never Smoker  . Smokeless tobacco: Never Used  Substance Use Topics  . Alcohol  use: No  . Drug use: No     Allergies   Patient has no known allergies.   Review of Systems Review of Systems Per HPI  Physical Exam Triage Vital Signs ED Triage Vitals  Enc Vitals Group     BP 02/16/21 1805 138/64     Pulse Rate 02/16/21 1805 97     Resp 02/16/21 1805 17     Temp 02/16/21 1805 98.9 F (37.2 C)     Temp Source 02/16/21 1805 Oral     SpO2 02/16/21 1805 100 %     Weight --      Height --      Head Circumference --      Peak Flow --      Pain Score 02/16/21 1804 0     Pain Loc --      Pain Edu? --      Excl. in [place]? --    No data found.  Updated Vital Signs BP 138/64 (BP Location: Right Arm)   Pulse 97   Temp 98.9 F (37.2 C) (Oral)   Resp 17   LMP 04/11/2012   SpO2 100%   Visual Acuity Right Eye Distance:   Left Eye Distance:   Bilateral Distance:    Right Eye Near:   Left Eye Near:    Bilateral Near:     Physical Exam Vitals and nursing note reviewed.  Constitutional:      Appearance: Normal appearance. She is not ill-appearing.  HENT:     Head: Atraumatic.     Mouth/Throat:     Mouth: Mucous membranes are moist.     Pharynx: Oropharynx is clear.  Eyes:     Extraocular Movements: Extraocular movements intact.     Conjunctiva/sclera: Conjunctivae normal.     Pupils: Pupils are equal, round, and reactive to light.  Cardiovascular:     Rate and Rhythm: Normal rate and regular rhythm.     Heart sounds: Normal heart sounds.  Pulmonary:     Effort: Pulmonary effort is normal.     Breath sounds: Normal breath sounds. No wheezing or rales.  Abdominal:     General: Bowel sounds are normal. There is no distension.     Palpations: Abdomen is soft.     Tenderness: There is no abdominal tenderness. There is no guarding.  Musculoskeletal:        General: Normal range of motion.     Cervical back: Normal range of motion and neck supple.  Skin:    General: Skin is warm and dry.  Neurological:     General: No focal deficit present.      Mental Status: She is alert and oriented to person, place, and time.     Cranial Nerves: No cranial nerve deficit.     Sensory: No sensory deficit.     Motor: No weakness.     Gait: Gait normal.  Psychiatric:        Mood and Affect: Mood normal.        Thought Content: Thought content normal.        Judgment: Judgment normal.    UC Treatments / Results  Labs (all labs ordered are listed, but only abnormal results are displayed) Labs Reviewed  CBC WITH DIFFERENTIAL/PLATELET - Abnormal; Notable for the following components:      Result Value   RBC 3.81 (*)    All other components within normal limits  TSH - Abnormal; Notable for the following components:   TSH 5.025 (*)    All other components within normal limits  COMPREHENSIVE METABOLIC PANEL    EKG   Radiology No results found.  Procedures Procedures (including critical care time)  Medications Ordered in UC Medications - No data to display  Initial Impression / Assessment and Plan / UC Course  I have reviewed the triage vital signs and the nursing notes.  Pertinent labs & imaging results that were available during my care of the patient were reviewed by me and considered in my medical decision making (see chart for details).     Exam and vital signs very reassuring today with no notable abnormalities.  We will recheck her basic labs to make sure everything is still stable, EKG today normal sinus rhythm without ST or T wave changes and very similar to the one done in the ED the other day.  Await lab results, rest, hydrate well, close follow-up with primary care in the next day or 2 for recheck and further management.  Go to the ED if symptoms worsen acutely at any time.  Final Clinical Impressions(s) / UC Diagnoses   Final diagnoses:  Dizziness  Essential hypertension   Discharge Instructions   None    ED Prescriptions    None     PDMP not reviewed this encounter.   Particia Nearing, [place] 02/19/21  1329

## 2021-07-20 ENCOUNTER — Other Ambulatory Visit: Payer: Self-pay | Admitting: Internal Medicine

## 2021-07-20 DIAGNOSIS — Z1231 Encounter for screening mammogram for malignant neoplasm of breast: Secondary | ICD-10-CM

## 2021-07-25 ENCOUNTER — Other Ambulatory Visit: Payer: Self-pay

## 2021-07-25 ENCOUNTER — Ambulatory Visit
Admission: RE | Admit: 2021-07-25 | Discharge: 2021-07-25 | Disposition: A | Discharge status: Home / Self Care | Payer: Managed Care, Other (non HMO) | Source: Ambulatory Visit | Attending: Internal Medicine | Admitting: Internal Medicine

## 2021-07-25 DIAGNOSIS — Z1231 Encounter for screening mammogram for malignant neoplasm of breast: Secondary | ICD-10-CM

## 2022-01-09 ENCOUNTER — Other Ambulatory Visit (HOSPITAL_COMMUNITY): Payer: Self-pay | Admitting: Gastroenterology

## 2022-01-09 ENCOUNTER — Other Ambulatory Visit: Payer: Self-pay | Admitting: Gastroenterology

## 2022-01-09 DIAGNOSIS — R1011 Right upper quadrant pain: Secondary | ICD-10-CM

## 2022-01-24 ENCOUNTER — Ambulatory Visit (HOSPITAL_COMMUNITY)
Admission: RE | Admit: 2022-01-24 | Discharge: 2022-01-24 | Disposition: A | Discharge status: Home / Self Care | Payer: Managed Care, Other (non HMO) | Source: Ambulatory Visit | Attending: Gastroenterology | Admitting: Gastroenterology

## 2022-01-24 ENCOUNTER — Other Ambulatory Visit: Payer: Self-pay

## 2022-01-24 DIAGNOSIS — R1011 Right upper quadrant pain: Secondary | ICD-10-CM | POA: Diagnosis present

## 2022-01-24 MED ORDER — TECHNETIUM TC 99M MEBROFENIN IV KIT
5.1000 | PACK | Freq: Once | INTRAVENOUS | Status: AC | PRN
  Administered 2022-01-24: 5.1 via INTRAVENOUS

## 2022-03-06 DIAGNOSIS — D509 Iron deficiency anemia, unspecified: Secondary | ICD-10-CM | POA: Diagnosis not present

## 2022-03-06 DIAGNOSIS — Z13220 Encounter for screening for lipoid disorders: Secondary | ICD-10-CM | POA: Diagnosis not present

## 2022-03-06 DIAGNOSIS — Z0000 Encounter for general adult medical examination without abnormal findings: Secondary | ICD-10-CM | POA: Diagnosis not present

## 2022-03-14 DIAGNOSIS — R946 Abnormal results of thyroid function studies: Secondary | ICD-10-CM | POA: Diagnosis not present

## 2022-03-28 DIAGNOSIS — E039 Hypothyroidism, unspecified: Secondary | ICD-10-CM | POA: Diagnosis not present

## 2022-05-12 DIAGNOSIS — E039 Hypothyroidism, unspecified: Secondary | ICD-10-CM | POA: Diagnosis not present

## 2022-05-12 DIAGNOSIS — R635 Abnormal weight gain: Secondary | ICD-10-CM | POA: Diagnosis not present

## 2022-08-23 IMAGING — US US ABDOMEN LIMITED
1 series · 14 of 25 positions shown · non-contrast
Comparison: None.

CLINICAL DATA: Right upper quadrant abdominal pain

EXAM:
ULTRASOUND ABDOMEN LIMITED RIGHT UPPER QUADRANT

[Series 1: us abdomen limited · 0.21mm/px · 14 of 47 slices shown]
[im 1/47]
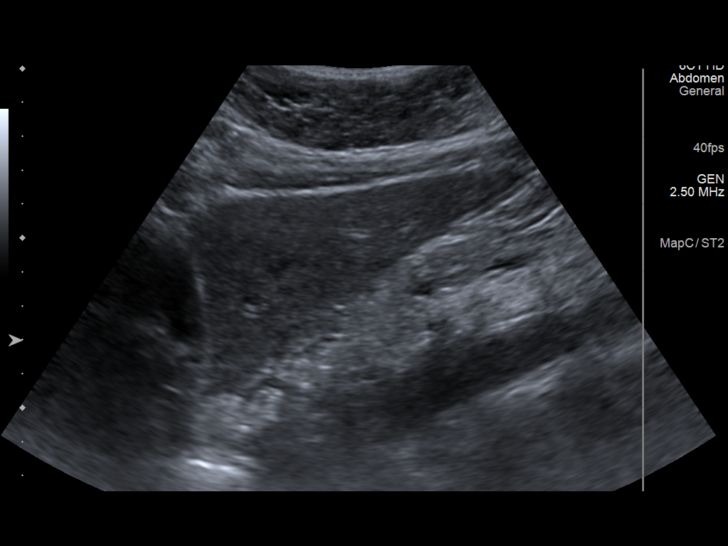
[im 4/47]
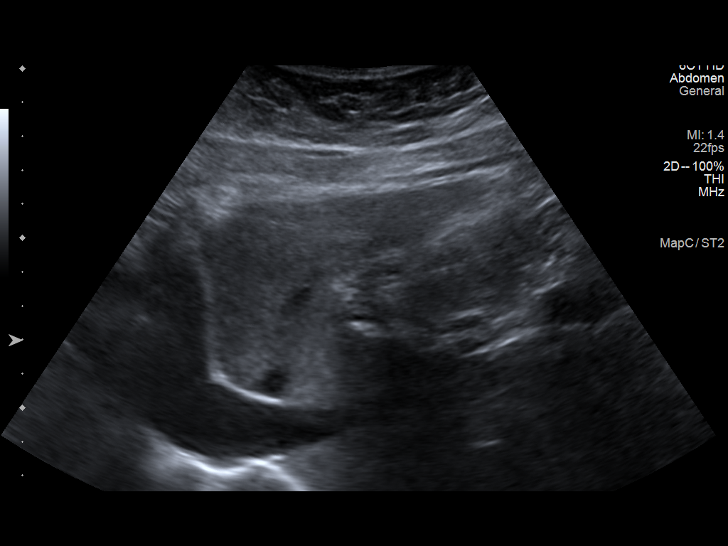
[im 8/47]
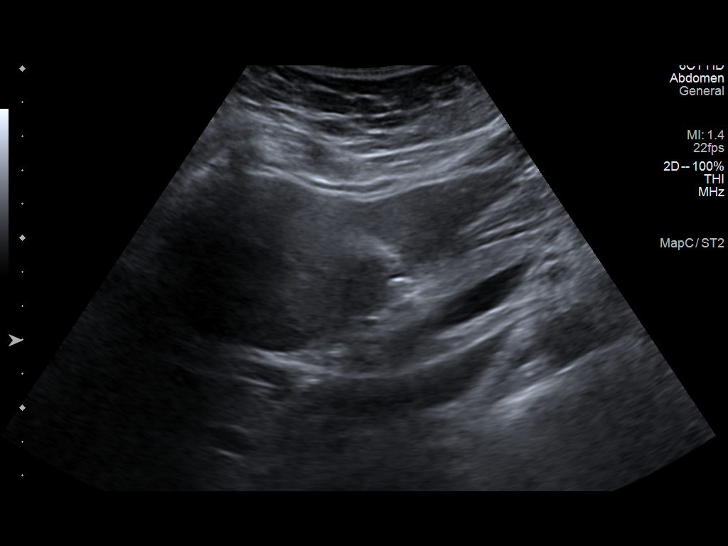
[im 12/47]
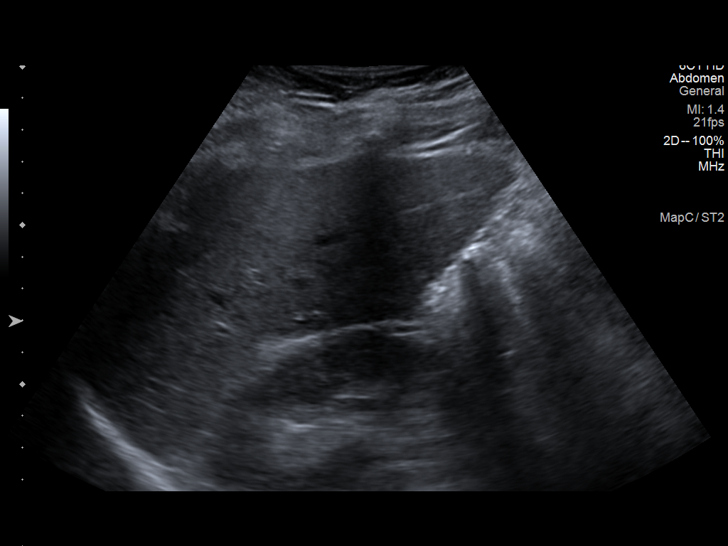
[im 16/47]
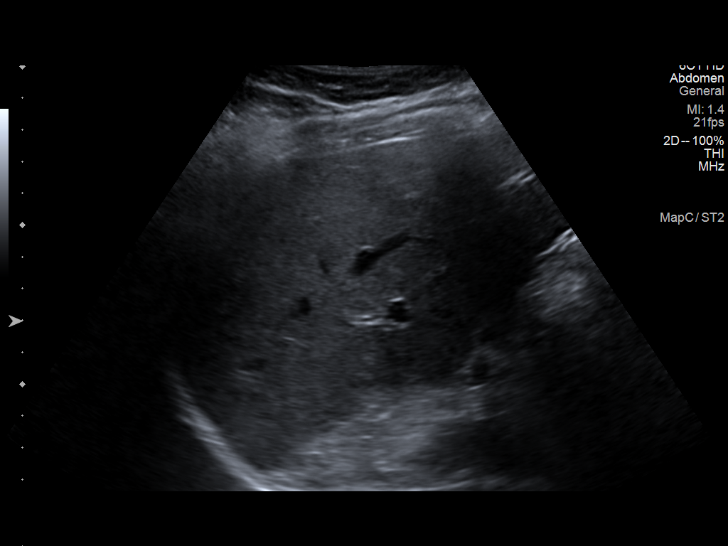
[im 18/47]
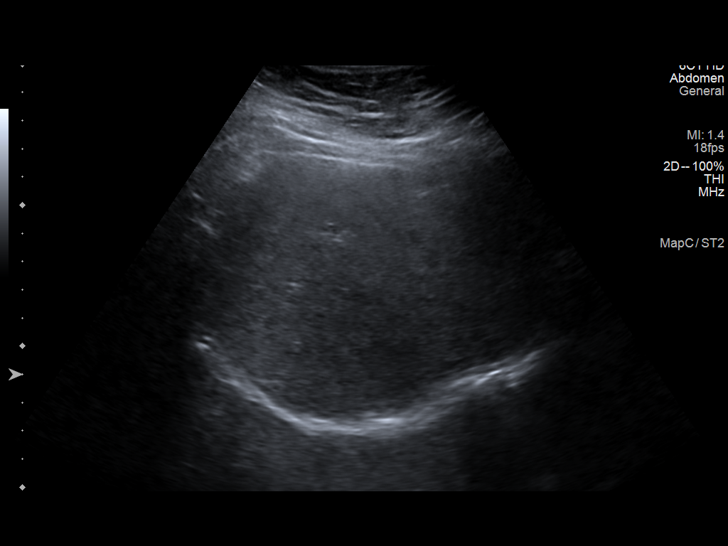
[im 22/47]
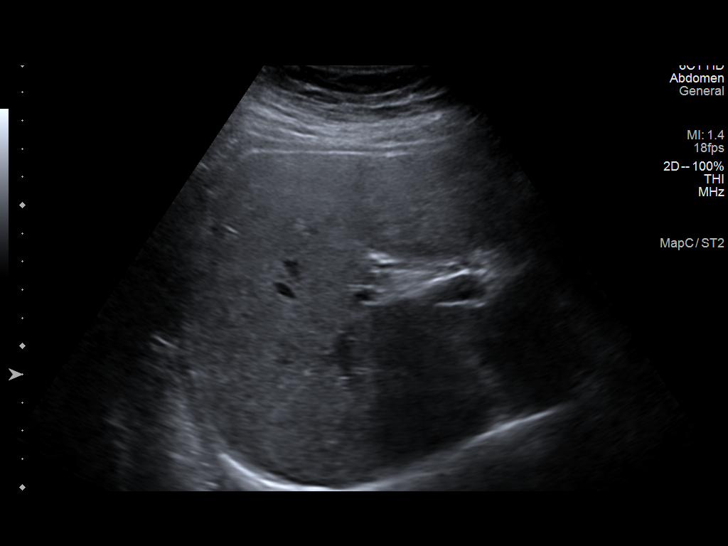
[im 25/47]
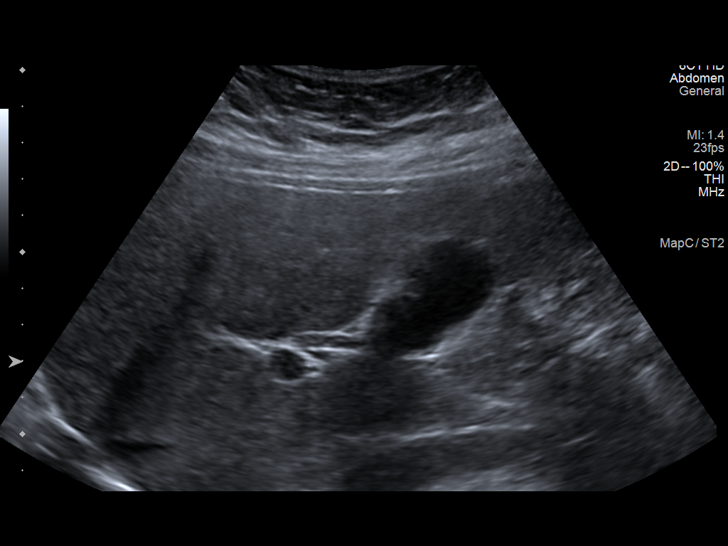
[im 29/47]
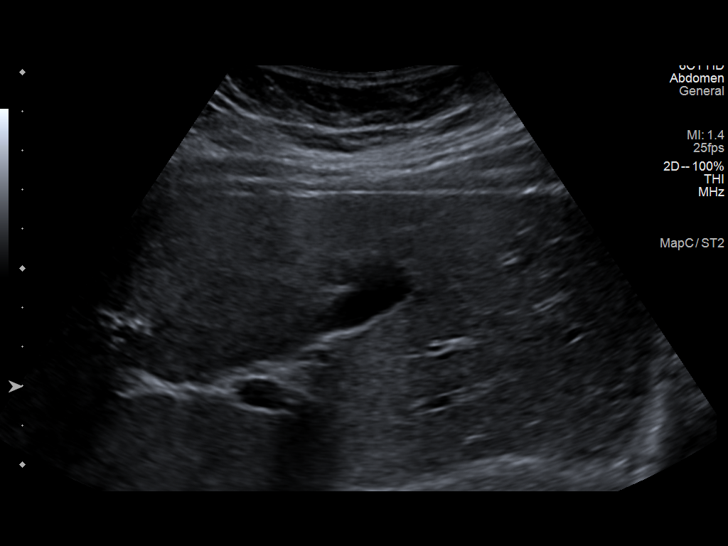
[im 31/47]
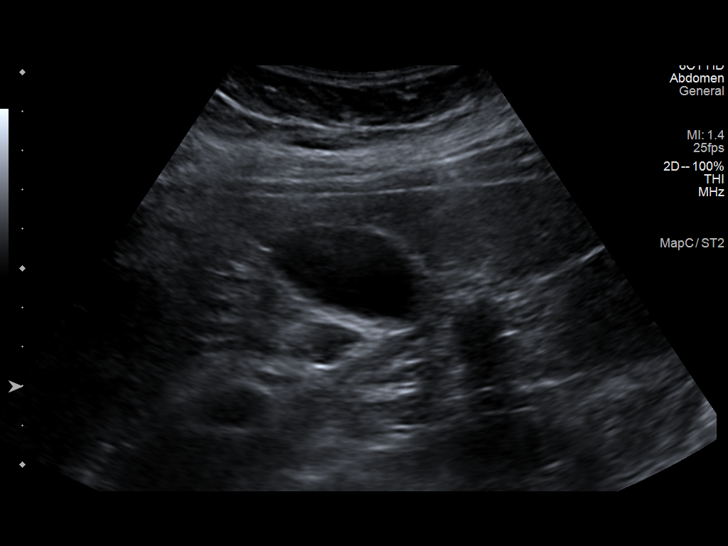
[im 35/47]
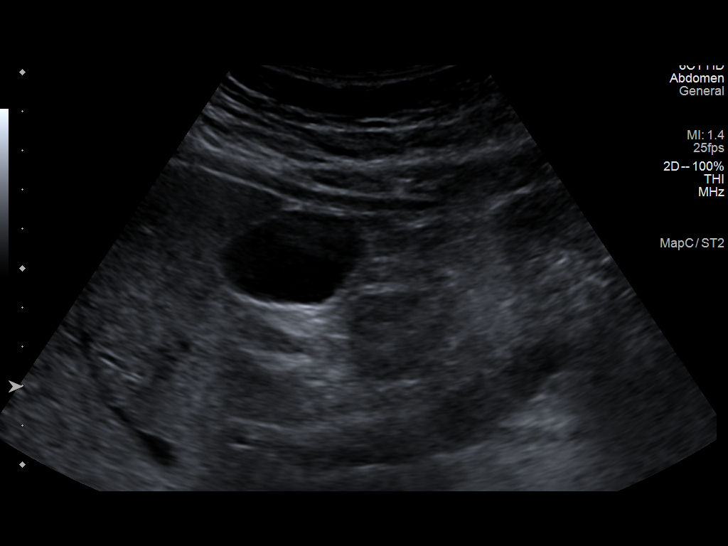
[im 39/47]
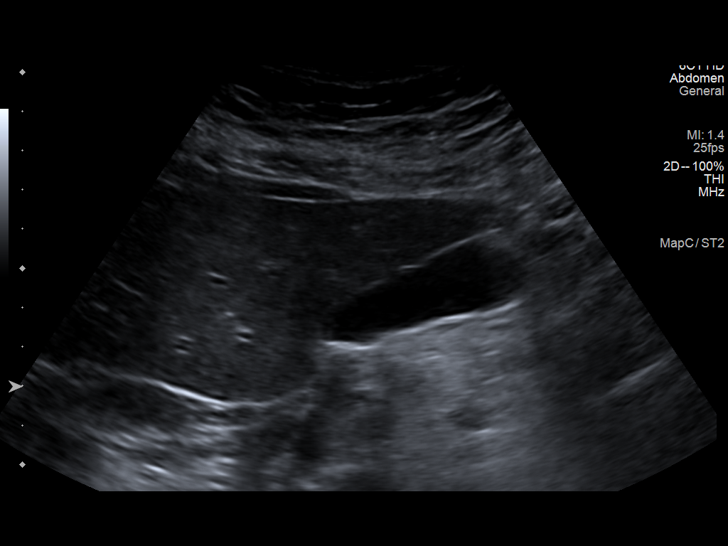
[im 43/47]
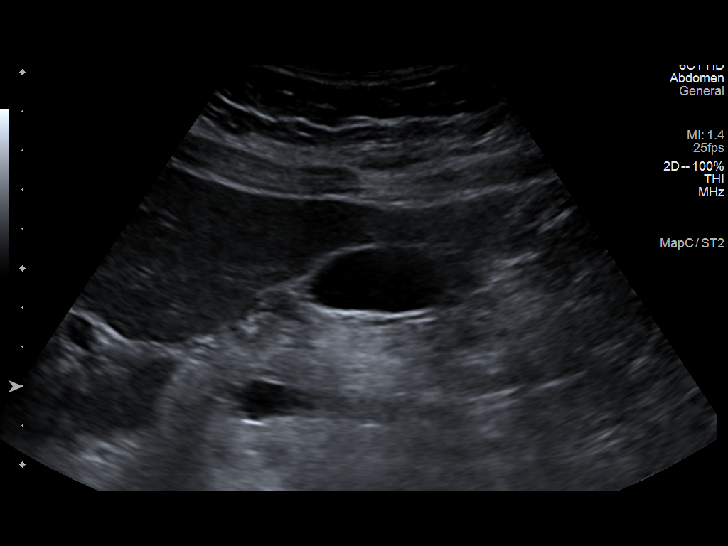
[im 47/47]
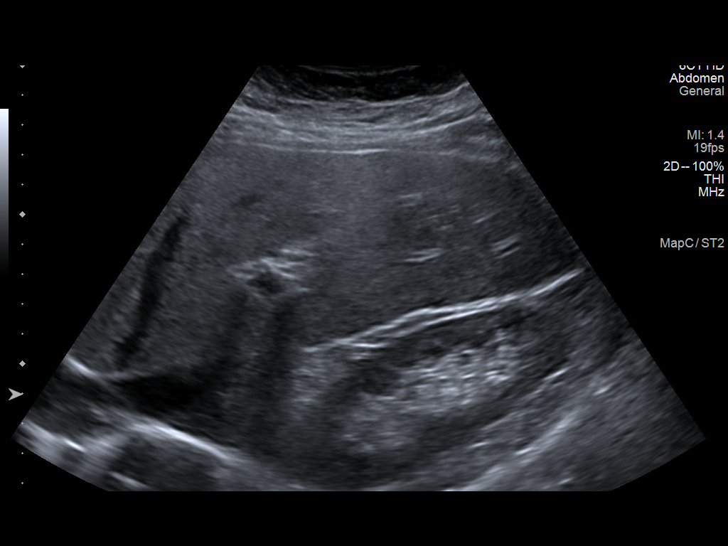

[14 of 25 positions shown; findings below may reference images not displayed]

FINDINGS: Gallbladder:

No gallstones or wall thickening visualized. No sonographic Murphy
sign noted by sonographer.

Common bile duct:

Diameter: 4 mm

Liver:

No focal lesion identified. Liver parenchymal echogenicity is
top-normal. Portal vein is patent on color Doppler imaging with
normal direction of blood flow towards the liver.

Other: None.
IMPRESSION: Normal right upper quadrant abdominal sonogram.  No cholelithiasis.

## 2022-11-24 DIAGNOSIS — E063 Autoimmune thyroiditis: Secondary | ICD-10-CM | POA: Diagnosis not present

## 2022-11-24 DIAGNOSIS — E039 Hypothyroidism, unspecified: Secondary | ICD-10-CM | POA: Diagnosis not present

## 2022-12-04 ENCOUNTER — Encounter (HOSPITAL_COMMUNITY): Payer: Self-pay | Admitting: *Deleted

## 2022-12-04 ENCOUNTER — Other Ambulatory Visit: Payer: Self-pay

## 2022-12-04 ENCOUNTER — Ambulatory Visit (HOSPITAL_COMMUNITY)
Admission: EM | Admit: 2022-12-04 | Discharge: 2022-12-04 | Disposition: A | Discharge status: Home / Self Care | Payer: BC Managed Care – PPO

## 2022-12-04 DIAGNOSIS — R002 Palpitations: Secondary | ICD-10-CM | POA: Diagnosis not present

## 2022-12-04 DIAGNOSIS — E039 Hypothyroidism, unspecified: Secondary | ICD-10-CM

## 2022-12-04 HISTORY — DX: Hypothyroidism, unspecified: E03.9

## 2022-12-04 LAB — CBG MONITORING, ED: Glucose-Capillary: 79 mg/dL (ref 70–99)

## 2022-12-04 NOTE — ED Provider Notes (Signed)
[facility]    CSN: [id number] Arrival date & time: 12/04/22  1647     History   Chief Complaint Chief Complaint  Patient presents with   rapid HR   Dizziness    HPI Brooke Rice is a 57 y.o. female.  Presents with intermittent rapid heart rate and dizziness x 1 week Reports she saw her PCP on 1/12, but only for TSH check Has been taking synthroid 75 mg daily without problems. Reports she started having episodes of positional vertigo and palpitations on and off, called her PCP who recommended "take only half the synthroid on sat and sun, and the full dose M-F"  Patient denies any current dizziness, chest pain, palpitations, shortness of breath No headaches, confusion, fevers, abdominal pain, vomiting, weakness, tremor  She does endorse history of anxiety, anxious feeling lately  Denies hx of DM  I cannot view PCP records, outside source.  Past Medical History:  Diagnosis Date   Hypothyroid     Patient Active Problem List   Diagnosis Date Noted   Hypothyroid 12/04/2022    Past Surgical History:  Procedure Laterality Date   CESAREAN SECTION      OB History   No obstetric history on file.     Home Medications    Prior to Admission medications   Medication Sig Start Date End Date Taking? Authorizing Provider  levothyroxine (SYNTHROID) 75 MCG tablet Take 75 mcg by mouth daily before breakfast. 11/24/22  Yes [provider]  acetaminophen (TYLENOL) 500 MG tablet Take 500 mg by mouth every 6 (six) hours as needed.    [provider]  Ferrous Sulfate (IRON) 142 (45 Fe) MG TBCR Take by mouth.    [provider]  Multiple Vitamin (MULTIVITAMIN) capsule Take 1 capsule by mouth daily.    [provider]  OVER THE COUNTER MEDICATION enzymes    [provider]    Family History Family History  Problem Relation Age of Onset   Breast cancer Neg Hx     Social History Social History   Tobacco Use    Smoking status: Never   Smokeless tobacco: Never  Substance Use Topics   Alcohol use: No   Drug use: No     Allergies   Patient has no known allergies.   Review of Systems Review of Systems As per HPI  Physical Exam Triage Vital Signs ED Triage Vitals  Enc Vitals Group     BP 12/04/22 1809 (!) 149/79     Pulse Rate 12/04/22 1809 76     Resp 12/04/22 1809 18     Temp 12/04/22 1809 98.6 F (37 C)     Temp src --      SpO2 12/04/22 1809 99 %     Weight --      Height --      Head Circumference --      Peak Flow --      Pain Score 12/04/22 1807 0     Pain Loc --      Pain Edu? --      Excl. in [place]? --    No data found.  Updated Vital Signs BP (!) 149/79   Pulse 76   Temp 98.6 F (37 C)   Resp 18   LMP 04/11/2012   SpO2 99%     Physical Exam Vitals and nursing note reviewed.  Constitutional:      General: She is not in acute distress.  Appearance: Normal appearance. She is not ill-appearing.  HENT:     Mouth/Throat:     Pharynx: Oropharynx is clear.  Eyes:     Conjunctiva/sclera: Conjunctivae normal.  Cardiovascular:     Rate and Rhythm: Normal rate and regular rhythm.     Pulses: Normal pulses.     Heart sounds: Normal heart sounds.  Pulmonary:     Effort: Pulmonary effort is normal.     Breath sounds: Normal breath sounds.  Abdominal:     Tenderness: There is no abdominal tenderness. There is no guarding.  Musculoskeletal:        General: Normal range of motion.     Cervical back: Normal range of motion.  Skin:    General: Skin is warm and dry.  Neurological:     General: No focal deficit present.     Mental Status: She is alert and oriented to person, place, and time.     Cranial Nerves: Cranial nerves 2-12 are intact. No cranial nerve deficit.     Motor: No weakness.     UC Treatments / Results  Labs (all labs ordered are listed, but only abnormal results are displayed) Labs Reviewed  CBG MONITORING, ED    EKG  Radiology No  results found.  Procedures Procedures (including critical care time)  Medications Ordered in UC Medications - No data to display  Initial Impression / Assessment and Plan / UC Course  I have reviewed the triage vital signs and the nursing notes.  Pertinent labs & imaging results that were available during my care of the patient were reviewed by me and considered in my medical decision making (see chart for details).  EKG normal sinus, ventricular rate of 72 bpm Patient without symptoms at this time. CBG 79 Unknown if symptoms are related to synthroid although likely. Consider anxiety component as well.  Patient is well appearing at this time with stable vitals, neuro exam intact. Low concern for acute cardiac or endocrine emergency at this time. Recommend to cut synthroid dose in half every day, not just weekend. Patient will call PCP first thing in the morning. We discussed strict ED precautions.  Patient agreeable to plan  Final Clinical Impressions(s) / UC Diagnoses   Final diagnoses:  Palpitations  Hypothyroidism, unspecified type     Discharge Instructions      As discussed, your EKG today is reassuring. Your heart rate is normal and your exam is great.  I recommend to cut your 75 mg dose IN HALF, and take only HALF a pill daily.  Please call your primary care first thing in the morning.  If at any point symptoms worsen, please go directly to the emergency department.      ED Prescriptions   None    PDMP not reviewed this encounter.   Kyra Leyland 12/04/22 [id number]

## 2022-12-04 NOTE — Discharge Instructions (Addendum)
As discussed, your EKG today is reassuring. Your heart rate is normal and your exam is great.  I recommend to cut your 75 mg dose IN HALF, and take only HALF a pill daily.  Please call your primary care first thing in the morning.  If at any point symptoms worsen, please go directly to the emergency department.

## 2022-12-04 NOTE — ED Triage Notes (Signed)
PT reports PCP has changed her thyroid medication . Pt saw PCP on 1-12. Pt was instructed to take 1/2 of 75mg  Levothyroxine . PT was instructed to take whole 75 mg of Levothyroxine. Pt reported dizzy and increased HR.

## 2022-12-08 ENCOUNTER — Telehealth: Payer: Self-pay

## 2022-12-08 NOTE — Telephone Encounter (Signed)
Please contact patient about scheduling new patient appointment

## 2022-12-09 ENCOUNTER — Other Ambulatory Visit: Payer: Self-pay

## 2022-12-09 ENCOUNTER — Emergency Department (HOSPITAL_COMMUNITY)
Admission: EM | Admit: 2022-12-09 | Discharge: 2022-12-09 | Disposition: A | Discharge status: Home / Self Care | Payer: BC Managed Care – PPO | Attending: Emergency Medicine | Admitting: Emergency Medicine

## 2022-12-09 DIAGNOSIS — R002 Palpitations: Secondary | ICD-10-CM | POA: Diagnosis not present

## 2022-12-09 DIAGNOSIS — E039 Hypothyroidism, unspecified: Secondary | ICD-10-CM | POA: Diagnosis not present

## 2022-12-09 DIAGNOSIS — Z79899 Other long term (current) drug therapy: Secondary | ICD-10-CM | POA: Insufficient documentation

## 2022-12-09 DIAGNOSIS — R55 Syncope and collapse: Secondary | ICD-10-CM

## 2022-12-09 DIAGNOSIS — R42 Dizziness and giddiness: Secondary | ICD-10-CM | POA: Insufficient documentation

## 2022-12-09 LAB — CBC WITH DIFFERENTIAL/PLATELET
Abs Immature Granulocytes: 0.01 10*3/uL (ref 0.00–0.07)
Basophils Absolute: 0 10*3/uL (ref 0.0–0.1)
Basophils Relative: 1 %
Eosinophils Absolute: 0.2 10*3/uL (ref 0.0–0.5)
Eosinophils Relative: 2 %
HCT: 37.9 % (ref 36.0–46.0)
Hemoglobin: 12.9 g/dL (ref 12.0–15.0)
Immature Granulocytes: 0 %
Lymphocytes Relative: 24 %
Lymphs Abs: 1.8 10*3/uL (ref 0.7–4.0)
MCH: 31.9 pg (ref 26.0–34.0)
MCHC: 34 g/dL (ref 30.0–36.0)
MCV: 93.8 fL (ref 80.0–100.0)
Monocytes Absolute: 0.6 10*3/uL (ref 0.1–1.0)
Monocytes Relative: 8 %
Neutro Abs: 4.8 10*3/uL (ref 1.7–7.7)
Neutrophils Relative %: 65 %
Platelets: 253 10*3/uL (ref 150–400)
RBC: 4.04 MIL/uL (ref 3.87–5.11)
RDW: 11.8 % (ref 11.5–15.5)
WBC: 7.4 10*3/uL (ref 4.0–10.5)
nRBC: 0 % (ref 0.0–0.2)

## 2022-12-09 LAB — COMPREHENSIVE METABOLIC PANEL
ALT: 19 U/L (ref 0–44)
AST: 25 U/L (ref 15–41)
Albumin: 3.8 g/dL (ref 3.5–5.0)
Alkaline Phosphatase: 70 U/L (ref 38–126)
Anion gap: 8 (ref 5–15)
BUN: 9 mg/dL (ref 6–20)
CO2: 22 mmol/L (ref 22–32)
Calcium: 9.1 mg/dL (ref 8.9–10.3)
Chloride: 108 mmol/L (ref 98–111)
Creatinine, Ser: 0.89 mg/dL (ref 0.44–1.00)
GFR, Estimated: >60 mL/min (ref >60)
Glucose, Bld: 98 mg/dL (ref 70–99)
Potassium: 3.7 mmol/L (ref 3.5–5.1)
Sodium: 138 mmol/L (ref 135–145)
Total Bilirubin: 0.8 mg/dL (ref 0.3–1.2)
Total Protein: 7.2 g/dL (ref 6.5–8.1)

## 2022-12-09 LAB — TSH: TSH: 3.104 u[IU]/mL (ref 0.350–4.500)

## 2022-12-09 NOTE — ED Triage Notes (Addendum)
Patient here with complaint of dizziness and anxiety that started when she was prescribed levothyroxine. Patient states she has hypothyroidism and thought her dose was too high and has been adjusting with her PCP but symptoms persist. Patient is alert, oriented, ambulating independently with steady gait, and is in no apparent distress at this time.

## 2022-12-09 NOTE — ED Provider Triage Note (Signed)
Emergency Medicine Provider Triage Evaluation Note  Brooke Rice , a 57 y.o. female  was evaluated in triage.  Pt complains of dizziness and lightheaded   Review of Systems  Positive: weakness Negative: fever  Physical Exam  BP (!) 152/75   Pulse 89   Temp 98.7 F (37.1 C) (Oral)   Resp 18   LMP 04/11/2012   SpO2 99%  Gen:   Awake, no distress   Resp:  Normal effort  MSK:   Moves extremities without difficulty  Other:    Medical Decision Making  Medically screening exam initiated at 1:30 PM.  Appropriate orders placed.  Brooke Rice was informed that the remainder of the evaluation will be completed by another provider, this initial triage assessment does not replace that evaluation, and the importance of remaining in the ED until their evaluation is complete.     Fransico Meadow, [place] 12/09/22 1331

## 2022-12-09 NOTE — Discharge Instructions (Addendum)
We signed his room for dizziness, near fainting, palpitations and shortness of breath/anxiety.  The workup in the emergency room is reassuring.  From history, it is not entirely clear and appears likely that your symptoms are directly related to medications or thyroid disease.  We recommend that you see your primary care doctor soon as possible.  Discussed with him if they think that you are having palpitations and require cardiac monitoring or if they think he needs syncope type workup.  We recommend that you be extremely careful with any activity and operating any heavy machinery.

## 2022-12-09 NOTE — ED Provider Notes (Signed)
[facility] Provider Note   CSN: [id number] Arrival date & time: 12/09/22  1300     History  Chief Complaint  Patient presents with   Dizziness    Brooke Rice is a 58 y.o. female.  HPI     57 year old patient comes in with chief complaint of dizziness. Patient has history of hypothyroidism for which she is on levothyroxine.  She states that sometime in October, she was driving to [place] and she had an episode of feeling dizzy.  She pulled over and went to urgent care at that time.  There was no abnormality discovered and she started feeling a lot better.  She saw her PCP earlier in January.  The visit was for her thyroid.  She was not experiencing the symptoms with significant distress, so it was not mentioned during that visit.  However after the visit, she had an episode of palpitations, feeling anxious, short of breath, dizzy and she talk to her PCP about it.  The PCP stated that patient's thyroid level was slightly low, she was going to adjust her thyroid medication.  With the adjustment, her symptoms actually progressed, so for the last week patient has not taken her medications.  She however continues to have episodes of dizziness, palpitations, her symptoms are typically unprovoked, she feels a lot better when she is sleeping or resting.  There is no associated chest pain.  Home Medications Prior to Admission medications   Medication Sig Start Date End Date Taking? Authorizing Provider  acetaminophen (TYLENOL) 500 MG tablet Take 500 mg by mouth every 6 (six) hours as needed.    [provider]  Ferrous Sulfate (IRON) 142 (45 Fe) MG TBCR Take by mouth.    [provider]  levothyroxine (SYNTHROID) 75 MCG tablet Take 75 mcg by mouth daily before breakfast. 11/24/22   [provider]  Multiple Vitamin (MULTIVITAMIN) capsule Take 1 capsule by mouth daily.    [provider]  OVER THE COUNTER  MEDICATION enzymes    [provider]      Allergies    Patient has no known allergies.    Review of Systems   Review of Systems  All other systems reviewed and are negative.   Physical Exam Updated Vital Signs BP 131/72   Pulse 62   Temp 98.7 F (37.1 C) (Oral)   Resp 15   LMP 04/11/2012   SpO2 96%  Physical Exam Vitals and nursing note reviewed.  Constitutional:      Appearance: She is well-developed.  HENT:     Head: Atraumatic.  Eyes:     Extraocular Movements: Extraocular movements intact.     Pupils: Pupils are equal, round, and reactive to light.  Cardiovascular:     Rate and Rhythm: Normal rate.  Pulmonary:     Effort: Pulmonary effort is normal.  Musculoskeletal:     Cervical back: Normal range of motion and neck supple.  Skin:    General: Skin is warm and dry.  Neurological:     Mental Status: She is alert and oriented to person, place, and time.     ED Results / Procedures / Treatments   Labs (all labs ordered are listed, but only abnormal results are displayed) Labs Reviewed  CBC WITH DIFFERENTIAL/PLATELET  COMPREHENSIVE METABOLIC PANEL  TSH  URINALYSIS, ROUTINE W REFLEX MICROSCOPIC    EKG EKG Interpretation  Date/Time:  Saturday December 09 2022 12:49:48 EST Ventricular Rate:  99  PR Interval:  158 QRS Duration: 80 QT Interval:  338 QTC Calculation: 433 R Axis:   64 Text Interpretation: Normal sinus rhythm Normal ECG When compared with ECG of 04-Dec-2022 18:35, PREVIOUS ECG IS PRESENT No acute changes No significant change since last tracing Confirmed by Varney Biles [phone]) on 12/09/2022 2:58:12 PM  Radiology No results found.  Procedures Procedures    Medications Ordered in ED Medications - No data to display  ED Course/ Medical Decision Making/ A&P                             Medical Decision Making 57 year old patient comes in with chief complaint of dizziness, anxiety.  It appears that her symptoms were mild and  infrequent starting October 2023, but over the last 2 weeks or so her symptoms have been more frequent and pronounced.  In the interim her thyroid medication was changed.  For the last 1 week, she has not taken her thyroid medication and continues to have episodes of palpitations, dizziness, near syncope.   Her neuroexam is nonfocal and reassuring.  She has no associated diplopia, dysarthria, vision loss, dysphagia, unilateral weakness, numbness.  Differential diagnosis considered for this patient includes cardiac arrhythmia, posterior circulation insufficiency, TIA, PE, CHF, severe electrolyte abnormality.  Low suspicion for symptoms related to her thyroid disease, which is actually hypothyroidism or the medication for it.  Patient has extremely low ABCD 2 score. She has no cardiac risk factors at all.  I discussed with the patient my low suspicion for emergent cardiac or neuro pathology.  She has PCP, she is amenable to close follow-up with her PCP.  I requested that she talk to her doctor about near syncope workup.  We will give her a work note.  Problems Addressed: Dizziness: undiagnosed new problem with uncertain prognosis Near syncope: undiagnosed new problem with uncertain prognosis Palpitations: undiagnosed new problem with uncertain prognosis    Final Clinical Impression(s) / ED Diagnoses Final diagnoses:  Dizziness  Palpitations  Near syncope    Rx / DC Orders ED Discharge Orders     None         Varney Biles, MD 12/09/22 1523

## 2022-12-26 ENCOUNTER — Ambulatory Visit (INDEPENDENT_AMBULATORY_CARE_PROVIDER_SITE_OTHER): Payer: BC Managed Care – PPO | Admitting: Internal Medicine

## 2022-12-26 ENCOUNTER — Encounter: Payer: Self-pay | Admitting: Internal Medicine

## 2022-12-26 VITALS — BP 120/70 | HR 91 | Ht 64.0 in | Wt 194.0 lb

## 2022-12-26 DIAGNOSIS — R002 Palpitations: Secondary | ICD-10-CM

## 2022-12-26 DIAGNOSIS — E039 Hypothyroidism, unspecified: Secondary | ICD-10-CM | POA: Diagnosis not present

## 2022-12-26 MED ORDER — LEVOTHYROXINE SODIUM 50 MCG PO TABS: 50.0000 ug | ORAL_TABLET | Freq: Every day | ORAL | 3 refills | Status: DC

## 2022-12-26 NOTE — Progress Notes (Signed)
Name: Brooke Rice  MRN/ DOB: [id number], 07-21-1966    Age/ Sex: 57 y.o., female    PCP: Leeroy Cha, MD   Reason for Endocrinology Evaluation: Hypothyroidism     Date of Initial Endocrinology Evaluation: 12/26/2022     HPI: [name] is a 57 y.o. female with a past medical history of Hypothyroidism . The patient presented for initial endocrinology clinic visit on 12/26/2022 for consultative assistance with her Hypothyroidism.   She has been diagnosed with hypothyroidism in 02/2021 with a TSH 5.025 uIU/mL  She did well on levothyroxine until October of 2023 when she developed an episode of palpitations, dizziness, and confusion  Patient presented to the ED with the symptoms She attributes her symptoms due to levothyroxine intake    Saw PCP on  1/12,2024 with normal TFT's but the next morning developed confusion , anxiety and near panic attack   She did change her diet last month by avoiding sugar sweetened beverages, and has been noted with weight loss since then   Denies constipation or diarrhea  Denies local neck swelling  Denies palpitations    Levothyroxine 75 mcg , half a tablet every other day or so   HISTORY:  Past Medical History:  Past Medical History:  Diagnosis Date   Hypothyroid    Past Surgical History:  Past Surgical History:  Procedure Laterality Date   CESAREAN SECTION      Social History:  reports that she has never smoked. She has never used smokeless tobacco. She reports that she does not drink alcohol and does not use drugs. Family History: family history is not on file.   HOME MEDICATIONS: Allergies as of 12/26/2022   No Known Allergies      Medication List        Accurate as of December 26, 2022 11:52 AM. If you have any questions, ask your nurse or doctor.          STOP taking these medications    Iron 142 (45 Fe) MG Tbcr Stopped by: Dorita Sciara, MD   multivitamin capsule Stopped by:  Dorita Sciara, MD       TAKE these medications    acetaminophen 500 MG tablet Commonly known as: TYLENOL Take 500 mg by mouth every 6 (six) hours as needed.   levothyroxine 75 MCG tablet Commonly known as: SYNTHROID Take 75 mcg by mouth daily before breakfast. TAKING LESS THAN HALF M-F   OVER THE COUNTER MEDICATION enzymes          REVIEW OF SYSTEMS: A comprehensive ROS was conducted with the patient and is negative except as per HPI     OBJECTIVE:  VS: BP 120/70 (BP Location: Left Arm, Patient Position: Sitting, Cuff Size: Large)   Pulse 91   Ht 5' 4"$  (1.626 m)   Wt 194 lb (88 kg)   LMP 04/11/2012   SpO2 99%   BMI 33.30 kg/m    Wt Readings from Last 3 Encounters:  12/26/22 194 lb (88 kg)  09/24/15 212 lb (96.2 kg)     EXAM: General: Pt appears well and is in NAD  Eyes: External eye exam normal without stare, lid lag or exophthalmos.  EOM intact.    Neck: General: Supple without adenopathy. Thyroid: Thyroid size normal.  No goiter or nodules appreciated. No thyroid bruit.  Lungs: Clear with good BS bilat with no rales, rhonchi, or wheezes  Heart: Auscultation: RRR.  Abdomen: Normoactive bowel sounds, soft, nontender,  without masses or organomegaly palpable  Extremities:  BL LE: No pretibial edema normal ROM and strength.  Mental Status: Judgment, insight: Intact Orientation: Oriented to time, place, and person Mood and affect: No depression, anxiety, or agitation     DATA REVIEWED:     Latest Reference Range & Units 12/09/22 14:00  TSH 0.350 - 4.500 uIU/mL 3.104    ASSESSMENT/PLAN/RECOMMENDATIONS:   Hypothyroidism:  -Patient is biochemically euthyroid -No local neck symptoms -She does attribute symptoms of palpitations/confusion/dizziness to levothyroxine intake, which is very unusual since she has been on it for over a year and a half with no problems.  She is not aware of change in manufacturer -She has been taking levothyroxine at  half a tablet intermittently, I did recommend reducing the dose and taking it daily  Medications : Stop levothyroxine 75 mcg Start levothyroxine 50 mcg daily  Recheck labs in 8 weeks  2.Palpitations/ Dizziness/Confusion:  -Patient attributes this to levothyroxine intake, but her symptoms and the onset is quite concerning for cardiac arrhythmia -I will refer her to cardiology for further evaluation of this -I am also going to screen her for pheochromocytoma with 24-hour urine   Follow-up in 6 months  Signed electronically by: Mack Guise, MD  [facility] Endocrinology  [facility] [address] [place], [place] [address] Phone: [phone] FAX: [phone]   CC: Leeroy Cha, MD [address] [place] [address] Phone: [phone] Fax: [phone]   Return to Endocrinology clinic as below: No future appointments.

## 2022-12-26 NOTE — Patient Instructions (Addendum)
Hold Biotin 2-3 days before any future thyroid blood work  Change levothyroxine 50 mcg daily    24-Hour Urine Collection  You will be collecting your urine for a 24-hour period of time. Your timer starts with your first urine of the morning (For example - If you first pee at [place], your timer will start at [place]) Fisk away your first urine of the morning Collect your urine every time you pee for the next 24 hours STOP your urine collection 24 hours after you started the collection (For example - You would stop at 9AM the day after you started)

## 2023-01-01 ENCOUNTER — Other Ambulatory Visit (INDEPENDENT_AMBULATORY_CARE_PROVIDER_SITE_OTHER): Payer: BC Managed Care – PPO

## 2023-01-01 ENCOUNTER — Telehealth: Payer: Self-pay

## 2023-01-01 DIAGNOSIS — E039 Hypothyroidism, unspecified: Secondary | ICD-10-CM | POA: Diagnosis not present

## 2023-01-01 DIAGNOSIS — R002 Palpitations: Secondary | ICD-10-CM

## 2023-01-01 LAB — TSH: TSH: 1.87 u[IU]/mL (ref 0.35–5.50)

## 2023-01-01 NOTE — Telephone Encounter (Signed)
Patient would like to know if you can prescribe something for constipation. Patient has been taking Miralax but has not helped.

## 2023-01-01 NOTE — Progress Notes (Deleted)
Total volume 1,900.  Started 12-31-22 7am, ended 01-01-23 7am.

## 2023-01-02 NOTE — Telephone Encounter (Signed)
Patient advised and will contact pcp for constipation concerns.

## 2023-01-07 NOTE — Progress Notes (Unsigned)
Cardiology Office Note:    Date:  01/08/2023   ID:  Brooke Rice, DOB 1966-07-31, MRN [id number]  PCP:  Leeroy Cha, MD  Cardiologist:  None  Electrophysiologist:  None   Referring MD: Nanci Pina*   Chief Complaint  Patient presents with   Palpitations    History of Present Illness:    Brooke Rice is a 57 y.o. female with a hx of hypothyroidism who is referred by Dr. Kelton Pillar for evaluation of palpitations.  She reports palpitations started last year.  Feels like heart is racing, occurs once every couple weeks.  Typically last for about 30 seconds.  She reports very lightheaded during episodes, denies any syncope. Denies any chest pain, dyspnea, lower extremity edema.  Reports she lifts weights at home and walks when the weather is nice.  Denies any exertional symptoms.  No smoking history.  No known family history of heart disease.    Past Medical History:  Diagnosis Date   Hypothyroid     Past Surgical History:  Procedure Laterality Date   CESAREAN SECTION      Current Medications: Current Meds  Medication Sig   acetaminophen (TYLENOL) 500 MG tablet Take 500 mg by mouth every 6 (six) hours as needed.   levothyroxine (SYNTHROID) 50 MCG tablet Take 1 tablet (50 mcg total) by mouth daily.   Omega-3 Fatty Acids (FISH OIL OMEGA-3 PO) Take by mouth every other day.   OVER THE COUNTER MEDICATION enzymes     Allergies:   Patient has no known allergies.   Social History   Socioeconomic History   Marital status: Single    Spouse name: Not on file   Number of children: Not on file   Years of education: Not on file   Highest education level: Not on file  Occupational History   Not on file  Tobacco Use   Smoking status: Never   Smokeless tobacco: Never  Substance and Sexual Activity   Alcohol use: No   Drug use: No   Sexual activity: Not on file  Other Topics Concern   Not on file  Social History Narrative   Not on file    Social Determinants of Health   Financial Resource Strain: Not on file  Food Insecurity: Not on file  Transportation Needs: Not on file  Physical Activity: Not on file  Stress: Not on file  Social Connections: Not on file     Family History: The patient's family history is negative for Breast cancer.  ROS:   Please see the history of present illness.     All other systems reviewed and are negative.  EKGs/Labs/Other Studies Reviewed:    The following studies were reviewed today:   EKG:   01/08/23: Normal sinus rhythm, rate 73, no ST abnormality  Recent Labs: 12/09/2022: ALT 19; BUN 9; Creatinine, Ser 0.89; Hemoglobin 12.9; Platelets 253; Potassium 3.7; Sodium 138 01/01/2023: TSH 1.87  Recent Lipid Panel No results found for: "CHOL", "TRIG", "HDL", "CHOLHDL", "VLDL", "LDLCALC", "LDLDIRECT"  Physical Exam:    VS:  BP 128/72   Pulse 73   Ht '5\' 4"'$  (1.626 m)   Wt 190 lb 3.2 oz (86.3 kg)   LMP 04/11/2012   SpO2 100%   BMI 32.65 kg/m     Wt Readings from Last 3 Encounters:  01/08/23 190 lb 3.2 oz (86.3 kg)  12/26/22 194 lb (88 kg)  09/24/15 212 lb (96.2 kg)     GEN:  Well nourished, well developed  in no acute distress HEENT: Normal NECK: No JVD; No carotid bruits LYMPHATICS: No lymphadenopathy CARDIAC: RRR, no murmurs, rubs, gallops RESPIRATORY:  Clear to auscultation without rales, wheezing or rhonchi  ABDOMEN: Soft, non-tender, non-distended MUSCULOSKELETAL:  No edema; No deformity  SKIN: Warm and dry NEUROLOGIC:  Alert and oriented x 3 PSYCHIATRIC:  Normal affect   ASSESSMENT:    1. Palpitations    PLAN:    Palpitations: Description concerning for arrhythmia, evaluate with Zio patch x 2 weeks.  Check echocardiogram to rule out structural heart disease  RTC in 6 months  Medication Adjustments/Labs and Tests Ordered: Current medicines are reviewed at length with the patient today.  Concerns regarding medicines are outlined above.  Orders Placed This  Encounter  Procedures   LONG TERM MONITOR (3-14 DAYS)   EKG 12-Lead   ECHOCARDIOGRAM COMPLETE   No orders of the defined types were placed in this encounter.   Patient Instructions  Medication Instructions:  Your physician recommends that you continue on your current medications as directed. Please refer to the Current Medication list given to you today.  *If you need a refill on your cardiac medications before your next appointment, please call your pharmacy*  Testing/Procedures: Your physician has requested that you have an echocardiogram. Echocardiography is a painless test that uses sound waves to create images of your heart. It provides your doctor with information about the size and shape of your heart and how well your heart's chambers and valves are working. This procedure takes approximately one hour. There are no restrictions for this procedure. Please do NOT wear cologne, perfume, aftershave, or lotions (deodorant is allowed). Please arrive 15 minutes prior to your appointment time.  ZIO XT- Long Term Monitor Instructions   Your physician has requested you wear a ZIO patch monitor for _14_ days.  This is a single patch monitor.   [employer] supplies one patch monitor per enrollment. Additional stickers are not available. Please do not apply patch if you will be having a Nuclear Stress Test, Echocardiogram, Cardiac CT, MRI, or Chest Xray during the period you would be wearing the monitor. The patch cannot be worn during these tests. You cannot remove and re-apply the ZIO XT patch monitor.  Your ZIO patch monitor will be sent Fed Ex from [employer] directly to your home address. It may take 3-5 days to receive your monitor after you have been enrolled.  Once you have received your monitor, please review the enclosed instructions. Your monitor has already been registered assigning a specific monitor serial # to you.  Billing and Patient Assistance Program Information   We  have supplied [employer] with any of your insurance information on file for billing purposes. [employer] offers a sliding scale Patient Assistance Program for patients that do not have insurance, or whose insurance does not completely cover the cost of the ZIO monitor.   You must apply for the Patient Assistance Program to qualify for this discounted rate.     To apply, please call [employer] at [phone], select option 4, then select option 2, and ask to apply for Patient Assistance Program.  Theodore Demark will ask your household income, and how many people are in your household.  They will quote your out-of-pocket cost based on that information.  [employer] will also be able to set up a 46-month interest-free payment plan if needed.  Applying the monitor   Shave hair from upper left chest.  Hold abrader disc by orange tab. Rub abrader in 40  strokes over the upper left chest as indicated in your monitor instructions.  Clean area with 4 enclosed alcohol pads. Let dry.  Apply patch as indicated in monitor instructions. Patch will be placed under collarbone on left side of chest with arrow pointing upward.  Rub patch adhesive wings for 2 minutes. Remove white label marked "1". Remove the white label marked "2". Rub patch adhesive wings for 2 additional minutes.  While looking in a mirror, press and release button in center of patch. A small green light will flash 3-4 times. This will be your only indicator that the monitor has been turned on. ?  Do not shower for the first 24 hours. You may shower after the first 24 hours.  Press the button if you feel a symptom. You will hear a small click. Record Date, Time and Symptom in the Patient Logbook.  When you are ready to remove the patch, follow instructions on the last 2 pages of the Patient Logbook. Stick patch monitor onto the last page of Patient Logbook.  Place Patient Logbook in the blue and white box.  Use locking tab on box and tape box closed securely.  The blue  and white box has prepaid postage on it. Please place it in the mailbox as soon as possible. Your physician should have your test results approximately 7 days after the monitor has been mailed back to [facility].  Call [place] at [phone] if you have questions regarding your ZIO XT patch monitor. Call them immediately if you see an orange light blinking on your monitor.  If your monitor falls off in less than 4 days, contact our Monitor department at [phone]. ?If your monitor becomes loose or falls off after 4 days call [employer] at [phone] for suggestions on securing your monitor.?  Follow-Up: At [facility], you and your health needs are our priority.  As part of our continuing mission to provide you with exceptional heart care, we have created designated Provider Care Teams.  These Care Teams include your primary Cardiologist (physician) and Advanced Practice Providers (APPs -  Physician Assistants and Nurse Practitioners) who all work together to provide you with the care you need, when you need it.  We recommend signing up for the patient portal called "MyChart".  Sign up information is provided on this After Visit Summary.  MyChart is used to connect with patients for Virtual Visits (Telemedicine).  Patients are able to view lab/test results, encounter notes, upcoming appointments, etc.  Non-urgent messages can be sent to your provider as well.   To learn more about what you can do with MyChart, go to [email or web address].    Your next appointment:   6 month(s)  Provider:   Dr. Gardiner Rhyme   Signed, Donato Heinz, MD  01/08/2023 8:41 AM    [place]

## 2023-01-08 ENCOUNTER — Ambulatory Visit: Payer: BC Managed Care – PPO | Attending: Cardiology

## 2023-01-08 ENCOUNTER — Other Ambulatory Visit: Payer: BC Managed Care – PPO

## 2023-01-08 ENCOUNTER — Ambulatory Visit: Payer: BC Managed Care – PPO | Attending: Cardiology | Admitting: Cardiology

## 2023-01-08 ENCOUNTER — Encounter: Payer: Self-pay | Admitting: Cardiology

## 2023-01-08 VITALS — BP 128/72 | HR 73 | Ht 64.0 in | Wt 190.2 lb

## 2023-01-08 DIAGNOSIS — R002 Palpitations: Secondary | ICD-10-CM | POA: Diagnosis not present

## 2023-01-08 NOTE — Progress Notes (Unsigned)
Enrolled for [employer] to mail a ZIO XT long term holter monitor to the patients address on file.  

## 2023-01-08 NOTE — Patient Instructions (Signed)
Medication Instructions:  Your physician recommends that you continue on your current medications as directed. Please refer to the Current Medication list given to you today.  *If you need a refill on your cardiac medications before your next appointment, please call your pharmacy*  Testing/Procedures: Your physician has requested that you have an echocardiogram. Echocardiography is a painless test that uses sound waves to create images of your heart. It provides your doctor with information about the size and shape of your heart and how well your heart's chambers and valves are working. This procedure takes approximately one hour. There are no restrictions for this procedure. Please do NOT wear cologne, perfume, aftershave, or lotions (deodorant is allowed). Please arrive 15 minutes prior to your appointment time.  ZIO XT- Long Term Monitor Instructions   Your physician has requested you wear a ZIO patch monitor for _14_ days.  This is a single patch monitor.   [employer] supplies one patch monitor per enrollment. Additional stickers are not available. Please do not apply patch if you will be having a Nuclear Stress Test, Echocardiogram, Cardiac CT, MRI, or Chest Xray during the period you would be wearing the monitor. The patch cannot be worn during these tests. You cannot remove and re-apply the ZIO XT patch monitor.  Your ZIO patch monitor will be sent Fed Ex from [employer] directly to your home address. It may take 3-5 days to receive your monitor after you have been enrolled.  Once you have received your monitor, please review the enclosed instructions. Your monitor has already been registered assigning a specific monitor serial # to you.  Billing and Patient Assistance Program Information   We have supplied [employer] with any of your insurance information on file for billing purposes. [employer] offers a sliding scale Patient Assistance Program for patients that do not have insurance,  or whose insurance does not completely cover the cost of the ZIO monitor.   You must apply for the Patient Assistance Program to qualify for this discounted rate.     To apply, please call [employer] at [phone], select option 4, then select option 2, and ask to apply for Patient Assistance Program.  Theodore Demark will ask your household income, and how many people are in your household.  They will quote your out-of-pocket cost based on that information.  [employer] will also be able to set up a 27-month interest-free payment plan if needed.  Applying the monitor   Shave hair from upper left chest.  Hold abrader disc by orange tab. Rub abrader in 40 strokes over the upper left chest as indicated in your monitor instructions.  Clean area with 4 enclosed alcohol pads. Let dry.  Apply patch as indicated in monitor instructions. Patch will be placed under collarbone on left side of chest with arrow pointing upward.  Rub patch adhesive wings for 2 minutes. Remove white label marked "1". Remove the white label marked "2". Rub patch adhesive wings for 2 additional minutes.  While looking in a mirror, press and release button in center of patch. A small green light will flash 3-4 times. This will be your only indicator that the monitor has been turned on. ?  Do not shower for the first 24 hours. You may shower after the first 24 hours.  Press the button if you feel a symptom. You will hear a small click. Record Date, Time and Symptom in the Patient Logbook.  When you are ready to remove the patch, follow instructions on the last  2 pages of the Patient Logbook. Stick patch monitor onto the last page of Patient Logbook.  Place Patient Logbook in the blue and white box.  Use locking tab on box and tape box closed securely.  The blue and white box has prepaid postage on it. Please place it in the mailbox as soon as possible. Your physician should have your test results approximately 7 days after the monitor has been  mailed back to [facility].  Call [place] at [phone] if you have questions regarding your ZIO XT patch monitor. Call them immediately if you see an orange light blinking on your monitor.  If your monitor falls off in less than 4 days, contact our Monitor department at [phone]. ?If your monitor becomes loose or falls off after 4 days call [employer] at [phone] for suggestions on securing your monitor.?  Follow-Up: At [facility], you and your health needs are our priority.  As part of our continuing mission to provide you with exceptional heart care, we have created designated Provider Care Teams.  These Care Teams include your primary Cardiologist (physician) and Advanced Practice Providers (APPs -  Physician Assistants and Nurse Practitioners) who all work together to provide you with the care you need, when you need it.  We recommend signing up for the patient portal called "MyChart".  Sign up information is provided on this After Visit Summary.  MyChart is used to connect with patients for Virtual Visits (Telemedicine).  Patients are able to view lab/test results, encounter notes, upcoming appointments, etc.  Non-urgent messages can be sent to your provider as well.   To learn more about what you can do with MyChart, go to [email or web address].    Your next appointment:   6 month(s)  Provider:   Dr. Gardiner Rhyme

## 2023-01-12 LAB — CATECHOLAMINES, FRACTIONATED, URINE, 24 HOUR
Calc Total (E+NE): 27 mcg/24 h (ref 26–121)
Creatinine, Urine mg/day-CATEUR: 1.11 g/(24.h) (ref 0.50–2.15)
Dopamine 24 Hr Urine: 263 mcg/24 h (ref 52–480)
Norepinephrine, 24H, Ur: 27 mcg/24 h (ref 15–100)
Total Volume: 1450 mL

## 2023-01-14 DIAGNOSIS — R002 Palpitations: Secondary | ICD-10-CM | POA: Diagnosis not present

## 2023-01-15 ENCOUNTER — Encounter: Payer: Self-pay | Admitting: Internal Medicine

## 2023-02-01 ENCOUNTER — Other Ambulatory Visit: Payer: Self-pay | Admitting: Internal Medicine

## 2023-02-01 ENCOUNTER — Ambulatory Visit (HOSPITAL_COMMUNITY): Payer: BC Managed Care – PPO | Attending: Cardiology

## 2023-02-01 DIAGNOSIS — R002 Palpitations: Secondary | ICD-10-CM | POA: Insufficient documentation

## 2023-02-01 DIAGNOSIS — Z1231 Encounter for screening mammogram for malignant neoplasm of breast: Secondary | ICD-10-CM

## 2023-02-01 LAB — ECHOCARDIOGRAM COMPLETE
Area-P 1/2: 3.24 cm2
S' Lateral: 2.2 cm

## 2023-02-03 DIAGNOSIS — R002 Palpitations: Secondary | ICD-10-CM | POA: Diagnosis not present

## 2023-02-23 ENCOUNTER — Other Ambulatory Visit: Payer: Self-pay | Admitting: Endocrinology

## 2023-02-23 ENCOUNTER — Other Ambulatory Visit (INDEPENDENT_AMBULATORY_CARE_PROVIDER_SITE_OTHER): Payer: BC Managed Care – PPO

## 2023-02-23 DIAGNOSIS — E039 Hypothyroidism, unspecified: Secondary | ICD-10-CM

## 2023-02-23 LAB — T4, FREE: Free T4: 0.81 ng/dL (ref 0.60–1.60)

## 2023-02-23 LAB — TSH: TSH: 2.81 u[IU]/mL (ref 0.35–5.50)

## 2023-02-27 ENCOUNTER — Encounter: Payer: Self-pay | Admitting: Internal Medicine

## 2023-03-20 ENCOUNTER — Ambulatory Visit
Admission: RE | Admit: 2023-03-20 | Discharge: 2023-03-20 | Disposition: A | Discharge status: Home / Self Care | Payer: BC Managed Care – PPO | Source: Ambulatory Visit | Attending: Internal Medicine | Admitting: Internal Medicine

## 2023-03-20 DIAGNOSIS — Z1231 Encounter for screening mammogram for malignant neoplasm of breast: Secondary | ICD-10-CM | POA: Diagnosis not present

## 2023-03-22 ENCOUNTER — Encounter: Payer: Self-pay | Admitting: Internal Medicine

## 2023-03-22 ENCOUNTER — Other Ambulatory Visit: Payer: Self-pay | Admitting: Internal Medicine

## 2023-03-22 DIAGNOSIS — R928 Other abnormal and inconclusive findings on diagnostic imaging of breast: Secondary | ICD-10-CM

## 2023-04-03 ENCOUNTER — Other Ambulatory Visit: Payer: Self-pay | Admitting: Internal Medicine

## 2023-04-03 ENCOUNTER — Ambulatory Visit: Payer: BC Managed Care – PPO

## 2023-04-03 ENCOUNTER — Ambulatory Visit
Admission: RE | Admit: 2023-04-03 | Discharge: 2023-04-03 | Disposition: A | Discharge status: Home / Self Care | Payer: BC Managed Care – PPO | Source: Ambulatory Visit | Attending: Internal Medicine | Admitting: Internal Medicine

## 2023-04-03 DIAGNOSIS — R92331 Mammographic heterogeneous density, right breast: Secondary | ICD-10-CM | POA: Diagnosis not present

## 2023-04-03 DIAGNOSIS — R922 Inconclusive mammogram: Secondary | ICD-10-CM | POA: Diagnosis not present

## 2023-04-03 DIAGNOSIS — R928 Other abnormal and inconclusive findings on diagnostic imaging of breast: Secondary | ICD-10-CM

## 2023-04-03 DIAGNOSIS — R921 Mammographic calcification found on diagnostic imaging of breast: Secondary | ICD-10-CM | POA: Diagnosis not present

## 2023-04-12 ENCOUNTER — Ambulatory Visit
Admission: RE | Admit: 2023-04-12 | Discharge: 2023-04-12 | Disposition: A | Discharge status: Home / Self Care | Payer: BC Managed Care – PPO | Source: Ambulatory Visit | Attending: Internal Medicine | Admitting: Internal Medicine

## 2023-04-12 DIAGNOSIS — N6011 Diffuse cystic mastopathy of right breast: Secondary | ICD-10-CM | POA: Diagnosis not present

## 2023-04-12 DIAGNOSIS — R928 Other abnormal and inconclusive findings on diagnostic imaging of breast: Secondary | ICD-10-CM

## 2023-04-12 DIAGNOSIS — R921 Mammographic calcification found on diagnostic imaging of breast: Secondary | ICD-10-CM | POA: Diagnosis not present

## 2023-04-12 HISTORY — PX: BREAST BIOPSY: SHX20

## 2023-06-27 ENCOUNTER — Encounter: Payer: Self-pay | Admitting: Internal Medicine

## 2023-06-27 ENCOUNTER — Ambulatory Visit (INDEPENDENT_AMBULATORY_CARE_PROVIDER_SITE_OTHER): Payer: BC Managed Care – PPO | Admitting: Internal Medicine

## 2023-06-27 VITALS — BP 122/80 | HR 109 | Ht 64.0 in | Wt 182.6 lb

## 2023-06-27 DIAGNOSIS — E039 Hypothyroidism, unspecified: Secondary | ICD-10-CM | POA: Diagnosis not present

## 2023-06-27 LAB — T4, FREE: Free T4: 0.98 ng/dL (ref 0.60–1.60)

## 2023-06-27 LAB — TSH: TSH: 1.93 u[IU]/mL (ref 0.35–5.50)

## 2023-06-27 NOTE — Progress Notes (Unsigned)
Name: Brooke Rice  MRN/ DOB: [id number], [birth year]    Age/ Sex: 57 y.o., female    PCP: No primary care provider on file.   Reason for Endocrinology Evaluation: Hypothyroidism     Date of Initial Endocrinology Evaluation: 12/26/2022    HPI: [name] is a 57 y.o. female with a past medical history of Hypothyroidism . The patient presented for initial endocrinology clinic visit on 12/26/2022 for consultative assistance with her Hypothyroidism.   She has been diagnosed with hypothyroidism in 02/2021 with a TSH 5.025 uIU/mL  She did well on levothyroxine until October of 2023 when she developed an episode of palpitations, dizziness, and confusion  Patient presented to the ED with these symptoms She attributes her symptoms due to levothyroxine intake    Saw PCP on  1/12,2024 with normal TFT's but the next morning developed confusion , anxiety and near panic attack    On her initial visit to our clinic her TSH was 1.87 uIU/mL, we opted to decrease levothyroxine from 75 mcg to 50 mcg daily, with repeat TSH 2.81 u IU/mL   Due to symptoms we will proceed with 24-hour urinary catecholamines which came back normal 12/2022  SUBJECTIVE:    Today (06/27/23): Brooke Rice is here for follow-up on hypothyroid.  She was evaluated by cardiology 12/2022 for palpitations, she had a monitor  Denies palpitations or dizziness Denies constipation or diarrhea  Denies local neck swelling  Denies tremors    Levothyroxine 50 mcg daily   HISTORY:  Past Medical History:  Past Medical History:  Diagnosis Date   Hypothyroid    Past Surgical History:  Past Surgical History:  Procedure Laterality Date   BREAST BIOPSY Right 04/12/2023   MM RT BREAST BX W LOC DEV 1ST LESION IMAGE BX SPEC STEREO GUIDE 04/12/2023 [facility]   CESAREAN SECTION      Social History:  reports that she has never smoked. She has never used smokeless tobacco. She reports that she does not  drink alcohol and does not use drugs. Family History: family history is not on file.   HOME MEDICATIONS: Allergies as of 06/27/2023   No Known Allergies      Medication List        Accurate as of June 27, 2023  7:22 AM. If you have any questions, ask your nurse or doctor.          acetaminophen 500 MG tablet Commonly known as: TYLENOL Take 500 mg by mouth every 6 (six) hours as needed.   FISH OIL OMEGA-3 PO Take by mouth every other day.   levothyroxine 50 MCG tablet Commonly known as: SYNTHROID Take 1 tablet (50 mcg total) by mouth daily.   OVER THE COUNTER MEDICATION enzymes          REVIEW OF SYSTEMS: A comprehensive ROS was conducted with the patient and is negative except as per HPI     OBJECTIVE:  VS: LMP 04/11/2012    Wt Readings from Last 3 Encounters:  01/08/23 190 lb 3.2 oz (86.3 kg)  12/26/22 194 lb (88 kg)  09/24/15 212 lb (96.2 kg)     EXAM: General: Pt appears well and is in NAD  Eyes: External eye exam normal without stare, lid lag or exophthalmos.  EOM intact.    Neck: General: Supple without adenopathy. Thyroid: Thyroid size normal.  No goiter or nodules appreciated. No thyroid bruit.  Lungs: Clear with good BS bilat with no rales, rhonchi,  or wheezes  Heart: Auscultation: RRR.  Abdomen: Normoactive bowel sounds, soft, nontender, without masses or organomegaly palpable  Extremities:  BL LE: No pretibial edema normal ROM and strength.  Mental Status: Judgment, insight: Intact Orientation: Oriented to time, place, and person Mood and affect: No depression, anxiety, or agitation     DATA REVIEWED:     Latest Reference Range & Units 12/09/22 14:00  TSH 0.350 - 4.500 uIU/mL 3.104    ASSESSMENT/PLAN/RECOMMENDATIONS:   Hypothyroidism:  -Patient is biochemically euthyroid -No local neck symptoms -She does attribute symptoms of palpitations/confusion/dizziness to levothyroxine intake, which is very unusual since she has been  on it for over a year and a half with no problems.  She is not aware of change in manufacturer -She has been taking levothyroxine at half a tablet intermittently, I did recommend reducing the dose and taking it daily  Medications :   levothyroxine 50 mcg daily   Follow-up in 6 months  Signed electronically by: Lyndle Herrlich, MD  [facility] Endocrinology  [facility] [address] [place] [place], [place] [address] Phone: [phone] FAX: [phone]   CC: No primary care provider on file. No primary provider on file. Phone: None Fax: None   Return to Endocrinology clinic as below: Future Appointments  Date Time Provider Department Center  06/27/2023 10:10 AM Toby Ayad, Konrad Dolores, MD [facility] None  07/12/2023  4:00 PM Little Ishikawa, MD [facility] None

## 2023-06-27 NOTE — Patient Instructions (Signed)

## 2023-06-28 MED ORDER — LEVOTHYROXINE SODIUM 50 MCG PO TABS: 50.0000 ug | ORAL_TABLET | Freq: Every day | ORAL | 3 refills | Status: DC

## 2023-07-12 ENCOUNTER — Ambulatory Visit: Payer: BC Managed Care – PPO | Admitting: Cardiology

## 2023-12-28 ENCOUNTER — Ambulatory Visit (INDEPENDENT_AMBULATORY_CARE_PROVIDER_SITE_OTHER): Payer: BC Managed Care – PPO | Admitting: Internal Medicine

## 2023-12-28 ENCOUNTER — Encounter: Payer: Self-pay | Admitting: Internal Medicine

## 2023-12-28 VITALS — BP 126/82 | HR 84 | Ht 64.0 in | Wt 189.0 lb

## 2023-12-28 DIAGNOSIS — E039 Hypothyroidism, unspecified: Secondary | ICD-10-CM | POA: Diagnosis not present

## 2023-12-28 NOTE — Progress Notes (Signed)
Name: Brooke Rice  MRN/ DOB: [id number], 08-Jan-1966    Age/ Sex: 58 y.o., female    PCP: Lorenda Ishihara   Reason for Endocrinology Evaluation: Hypothyroidism     Date of Initial Endocrinology Evaluation: 12/26/2022    HPI: Brooke Rice is a 58 y.o. female with a past medical history of Hypothyroidism . The patient presented for initial endocrinology clinic visit on 12/26/2022 for consultative assistance with her Hypothyroidism.   She has been diagnosed with hypothyroidism in 02/2021 with a TSH 5.025 uIU/mL  She did well on levothyroxine until October of 2023 when she developed an episode of palpitations, dizziness, and confusion  Patient presented to the ED with these symptoms She attributes her symptoms due to levothyroxine intake    Saw PCP on  1/12,2024 with normal TFT's but the next morning developed confusion , anxiety and near panic attack    On her initial visit to our clinic her TSH was 1.87 uIU/mL, we opted to decrease levothyroxine from 75 mcg to 50 mcg daily, with repeat TSH 2.81 u IU/mL   Due to symptoms we will proceed with 24-hour urinary catecholamines which came back normal 12/2022  SUBJECTIVE:    Today (12/28/23): Brooke Rice is here for follow-up on hypothyroid.  Pt has been noted with weight loss  Denies palpitations  Denies dizziness  Denies constipation or diarrhea  Denies local neck swelling  Denies tremors  No Biotin   Levothyroxine 50 mcg daily   HISTORY:  Past Medical History:  Past Medical History:  Diagnosis Date   Hypothyroid    Past Surgical History:  Past Surgical History:  Procedure Laterality Date   BREAST BIOPSY Right 04/12/2023   MM RT BREAST BX W LOC DEV 1ST LESION IMAGE BX SPEC STEREO GUIDE 04/12/2023 [facility]   CESAREAN SECTION      Social History:  reports that she has never smoked. She has never used smokeless tobacco. She reports that she does not drink alcohol and does not use  drugs. Family History: family history is not on file.   HOME MEDICATIONS: Allergies as of 12/28/2023   No Known Allergies      Medication List        Accurate as of December 28, 2023  2:15 PM. If you have any questions, ask your nurse or doctor.          acetaminophen 500 MG tablet Commonly known as: TYLENOL Take 500 mg by mouth every 6 (six) hours as needed.   FISH OIL OMEGA-3 PO Take by mouth every other day.   levothyroxine 50 MCG tablet Commonly known as: SYNTHROID Take 1 tablet (50 mcg total) by mouth daily.   OVER THE COUNTER MEDICATION enzymes          REVIEW OF SYSTEMS: A comprehensive ROS was conducted with the patient and is negative except as per HPI     OBJECTIVE:  VS: BP 126/82 (BP Location: Left Arm, Patient Position: Sitting, Cuff Size: Normal)   Pulse 84   Ht 5\' 4"  (1.626 m)   Wt 189 lb (85.7 kg)   LMP 04/11/2012   SpO2 99%   BMI 32.44 kg/m    Wt Readings from Last 3 Encounters:  12/28/23 189 lb (85.7 kg)  06/27/23 182 lb 9.6 oz (82.8 kg)  01/08/23 190 lb 3.2 oz (86.3 kg)     EXAM: General: Pt appears well and is in NAD  Eyes: External eye exam normal without stare, lid  lag or exophthalmos.  EOM intact.    Neck: General: Supple without adenopathy. Thyroid: Thyroid size normal.  No goiter or nodules appreciated.  Lungs: Clear with good BS bilat   Heart: Auscultation: RRR.  Abdomen: Soft, nontender  Extremities:  BL LE: No pretibial edema   Mental Status: Judgment, insight: Intact Orientation: Oriented to time, place, and person Mood and affect: No depression, anxiety, or agitation     DATA REVIEWED:   Latest Reference Range & Units 12/28/23 14:24  TSH 0.40 - 4.50 mIU/L 1.54  T4,Free(Direct) 0.8 - 1.8 ng/dL 1.2    ASSESSMENT/PLAN/RECOMMENDATIONS:    Hypothyroidism:  -Patient is  euthyroid -No local neck symptoms - - Pt educated extensively on the correct way to take levothyroxine (first thing in the morning with  water, 30 minutes before eating or taking other medications). - Pt encouraged to double dose the following day if she were to miss a dose given long half-life of levothyroxine. - TFT's remain within normal range, no change  Medications :  Continue levothyroxine 50 mcg daily   Follow-up in 1 yr   Signed electronically by: Lyndle Herrlich, MD  [facility] Endocrinology  [facility] [address] [place] [place], [place] [address] Phone: [phone] FAX: [phone]   CC: Lorenda Ishihara No address on file Phone: None Fax: None   Return to Endocrinology clinic as below: Future Appointments  Date Time Provider Department Center  12/28/2023  2:20 PM Clelia Trabucco, Konrad Dolores, MD [facility] None

## 2023-12-28 NOTE — Patient Instructions (Signed)

## 2023-12-29 LAB — TSH: TSH: 1.54 m[IU]/L (ref 0.40–4.50)

## 2023-12-29 LAB — T4, FREE: Free T4: 1.2 ng/dL (ref 0.8–1.8)

## 2023-12-31 ENCOUNTER — Encounter: Payer: Self-pay | Admitting: Internal Medicine

## 2023-12-31 MED ORDER — LEVOTHYROXINE SODIUM 50 MCG PO TABS: 50.0000 ug | ORAL_TABLET | Freq: Every day | ORAL | 3 refills | Status: DC

## 2024-03-01 ENCOUNTER — Encounter (HOSPITAL_COMMUNITY): Payer: Self-pay

## 2024-03-01 ENCOUNTER — Ambulatory Visit (HOSPITAL_COMMUNITY)
Admission: EM | Admit: 2024-03-01 | Discharge: 2024-03-01 | Disposition: A | Discharge status: Home / Self Care | Attending: Physician Assistant | Admitting: Physician Assistant

## 2024-03-01 DIAGNOSIS — H6121 Impacted cerumen, right ear: Secondary | ICD-10-CM | POA: Diagnosis not present

## 2024-03-01 DIAGNOSIS — H60391 Other infective otitis externa, right ear: Secondary | ICD-10-CM | POA: Diagnosis not present

## 2024-03-01 MED ORDER — OFLOXACIN 0.3 % OT SOLN: 5.0000 [drp] | Freq: Two times a day (BID) | OTIC | 0 refills | Status: AC

## 2024-03-01 NOTE — ED Triage Notes (Signed)
 Patient reports Q-tip in her left ear.

## 2024-03-01 NOTE — ED Provider Notes (Signed)
 [facility]    CSN: [id number] Arrival date & time: 03/01/24  1005      History   Chief Complaint Chief Complaint  Patient presents with   Foreign Body in Ear    HPI Brooke Rice is a 58 y.o. female.   Patient presents today with a several hour history of a full sensation and decreased hearing of her right ear.  She reports that it has been itching and irritated so she used a Q-tip.  She is concerned that the cotton portion of the Q-tip is stuck in the ear.  She does not wear earbuds or earplugs on a regular basis.  She denies any associated pain.  Denies any otorrhea.  Denies any recent swimming or airplane travel.  Denies any fever, cough, congestion.  She has not tried any over-the-counter medications for symptom management.    Past Medical History:  Diagnosis Date   Hypothyroid     Patient Active Problem List   Diagnosis Date Noted   Hypothyroid 12/04/2022    Past Surgical History:  Procedure Laterality Date   BREAST BIOPSY Right 04/12/2023   MM RT BREAST BX W LOC DEV 1ST LESION IMAGE BX SPEC STEREO GUIDE 04/12/2023 [facility]   CESAREAN SECTION      OB History   No obstetric history on file.      Home Medications    Prior to Admission medications   Medication Sig Start Date End Date Taking? Authorizing Provider  levothyroxine  (SYNTHROID ) 50 MCG tablet Take 1 tablet (50 mcg total) by mouth daily. 12/31/23  Yes Shamleffer, Ibtehal Jaralla, MD  ofloxacin  (FLOXIN ) 0.3 % OTIC solution Place 5 drops into the right ear 2 (two) times daily. 03/01/24  Yes Talaysia Pinheiro, Betsey Brow, PA-C    Family History Family History  Problem Relation Age of Onset   Breast cancer Neg Hx     Social History Social History   Tobacco Use   Smoking status: Never   Smokeless tobacco: Never  Substance Use Topics   Alcohol use: No   Drug use: No     Allergies   Patient has no known allergies.   Review of Systems Review of Systems  Constitutional:  Negative  for activity change, appetite change, fatigue and fever.  HENT:  Positive for hearing loss. Negative for congestion, ear discharge, ear pain, sinus pressure, sneezing and sore throat.   Respiratory:  Negative for cough.   Neurological:  Negative for headaches.     Physical Exam Triage Vital Signs ED Triage Vitals [03/01/24 1038]  Encounter Vitals Group     BP 136/62     Systolic BP Percentile      Diastolic BP Percentile      Pulse Rate (!) 102     Resp 18     Temp 98.2 F (36.8 C)     Temp Source Oral     SpO2 97 %     Weight      Height      Head Circumference      Peak Flow      Pain Score      Pain Loc      Pain Education      Exclude from Growth Chart    No data found.  Updated Vital Signs BP 136/62 (BP Location: Left Arm)   Pulse 79   Temp 98.2 F (36.8 C) (Oral)   Resp 18   LMP 04/11/2012   SpO2 98%   Visual Acuity  Right Eye Distance:   Left Eye Distance:   Bilateral Distance:    Right Eye Near:   Left Eye Near:    Bilateral Near:     Physical Exam Vitals reviewed.  Constitutional:      General: She is awake. She is not in acute distress.    Appearance: Normal appearance. She is well-developed. She is not ill-appearing.     Comments: Very pleasant female appears stated age in no acute distress sitting comfortably in exam room  HENT:     Head: Normocephalic and atraumatic.     Right Ear: External ear normal. There is impacted cerumen.     Left Ear: Tympanic membrane, ear canal and external ear normal. Tympanic membrane is not erythematous or bulging.     Ears:     Comments: Right ear: Cerumen infection noted.  Unable to visualize TM.   Right ear after irrigation: Cerumen was successfully removed revealing a normal TM.  Ear canal was erythematous and macerated.    Mouth/Throat:     Pharynx: Uvula midline. No oropharyngeal exudate or posterior oropharyngeal erythema.  Cardiovascular:     Rate and Rhythm: Normal rate and regular rhythm.     Heart  sounds: Normal heart sounds, S1 normal and S2 normal. No murmur heard. Pulmonary:     Effort: Pulmonary effort is normal.     Breath sounds: Normal breath sounds. No wheezing, rhonchi or rales.     Comments: Clear to auscultation bilaterally Psychiatric:        Behavior: Behavior is cooperative.      UC Treatments / Results  Labs (all labs ordered are listed, but only abnormal results are displayed) Labs Reviewed - No data to display  EKG   Radiology No results found.  Procedures Procedures (including critical care time)  Medications Ordered in UC Medications - No data to display  Initial Impression / Assessment and Plan / UC Course  I have reviewed the triage vital signs and the nursing notes.  Pertinent labs & imaging results that were available during my care of the patient were reviewed by me and considered in my medical decision making (see chart for details).     Patient is well-appearing, afebrile, nontoxic, nontachycardic.  Cerumen impaction was noted on exam and we discussed that this is likely causing her symptoms.  This was successfully removed with in office irrigation.  Following the irrigation she did have significant erythema and maceration of the ear canal.  Will cover for otitis externa with ofloxacin  drops twice daily for 7 days.  Discussed that she is to keep her ear facing upward for a few minutes after applying the medication to allow to completely penetrate the ear canal.  She is to avoid putting anything in the ear including earbuds, Q-tips, earplugs and avoid submerging her head in water for the next week.  If anything worsens or changes she is to return for reevaluation including otorrhea, fever, increasing pain, nausea, vomiting.  Strict return precautions given.    Final Clinical Impressions(s) / UC Diagnoses   Final diagnoses:  Hearing loss of right ear due to cerumen impaction  Impacted cerumen of right ear  Infective otitis externa of right ear      Discharge Instructions      We were able to remove the wax.  Your ear canal was irritated which is likely because of the irrigation.  I would like you to use eardrops to prevent infection.  Please use twice daily for the  next week.  Keep your ear facing upward for a few minutes after putting the drops as it can go all the way through the ear canal.  Do not put anything in your ear including earbuds, Q-tips, earplugs.  Do not submerge your head in water for the next week.  If anything worsens you have pain, swelling of the ear canal, drainage from the ear, fever, trouble hearing you should be seen immediately.     ED Prescriptions     Medication Sig Dispense Auth. Provider   ofloxacin  (FLOXIN ) 0.3 % OTIC solution Place 5 drops into the right ear 2 (two) times daily. 5 mL Julianny Milstein K, PA-C      PDMP not reviewed this encounter.   Budd Cargo, PA-C 03/01/24 1155

## 2024-03-01 NOTE — Discharge Instructions (Addendum)
 We were able to remove the wax.  Your ear canal was irritated which is likely because of the irrigation.  I would like you to use eardrops to prevent infection.  Please use twice daily for the next week.  Keep your ear facing upward for a few minutes after putting the drops as it can go all the way through the ear canal.  Do not put anything in your ear including earbuds, Q-tips, earplugs.  Do not submerge your head in water for the next week.  If anything worsens you have pain, swelling of the ear canal, drainage from the ear, fever, trouble hearing you should be seen immediately.

## 2024-03-19 ENCOUNTER — Other Ambulatory Visit: Payer: Self-pay | Admitting: Internal Medicine

## 2024-03-19 DIAGNOSIS — Z1231 Encounter for screening mammogram for malignant neoplasm of breast: Secondary | ICD-10-CM

## 2024-03-21 ENCOUNTER — Ambulatory Visit
Admission: RE | Admit: 2024-03-21 | Discharge: 2024-03-21 | Disposition: A | Discharge status: Home / Self Care | Source: Ambulatory Visit | Attending: Internal Medicine | Admitting: Internal Medicine

## 2024-03-21 DIAGNOSIS — Z1231 Encounter for screening mammogram for malignant neoplasm of breast: Secondary | ICD-10-CM | POA: Diagnosis not present

## 2024-12-16 ENCOUNTER — Other Ambulatory Visit

## 2024-12-16 ENCOUNTER — Ambulatory Visit: Admitting: Internal Medicine

## 2024-12-16 ENCOUNTER — Encounter: Payer: Self-pay | Admitting: Internal Medicine

## 2024-12-16 VITALS — BP 124/84 | HR 76 | Ht 64.0 in | Wt 189.0 lb

## 2024-12-16 DIAGNOSIS — E039 Hypothyroidism, unspecified: Secondary | ICD-10-CM

## 2024-12-16 NOTE — Progress Notes (Unsigned)
 "   Name: Brooke Rice  MRN/ DOB: [id number], Mar 11, 1966    Age/ Sex: 59 y.o., female    PCP: Elliot Charm (Inactive)   Reason for Endocrinology Evaluation: Hypothyroidism     Date of Initial Endocrinology Evaluation: 12/26/2022    HPI: [name] is a 59 y.o. female with a past medical history of Hypothyroidism . The patient presented for initial endocrinology clinic visit on 12/26/2022 for consultative assistance with her Hypothyroidism.   She has been diagnosed with hypothyroidism in 02/2021 with a TSH 5.025 uIU/mL  She did well on levothyroxine  until October of 2023 when she developed an episode of palpitations, dizziness, and confusion  Patient presented to the ED with these symptoms She attributes her symptoms due to levothyroxine  intake    Saw PCP on  1/12,2024 with normal TFT's but the next morning developed confusion , anxiety and near panic attack    On her initial visit to our clinic her TSH was 1.87 uIU/mL, we opted to decrease levothyroxine  from 75 mcg to 50 mcg daily, with repeat TSH 2.81 u IU/mL   Due to symptoms we will proceed with 24-hour urinary catecholamines which came back normal 12/2022  SUBJECTIVE:    Today (12/16/24): Brooke Rice is here for follow-up on hypothyroid.   Weight remains stable No local neck swelling  No palpitations  No constipation or diarrhea  No tremors     Biotin on hold   Levothyroxine  50 mcg daily   HISTORY:  Past Medical History:  Past Medical History:  Diagnosis Date   Hypothyroid    Past Surgical History:  Past Surgical History:  Procedure Laterality Date   BREAST BIOPSY Right 04/12/2023   MM RT BREAST BX W LOC DEV 1ST LESION IMAGE BX SPEC STEREO GUIDE 04/12/2023 [facility]   CESAREAN SECTION      Social History:  reports that she has never smoked. She has never used smokeless tobacco. She reports that she does not drink alcohol and does not use drugs. Family History: family  history is not on file.   HOME MEDICATIONS: Allergies as of 12/16/2024   No Known Allergies      Medication List        Accurate as of December 16, 2024 11:28 AM. If you have any questions, ask your nurse or doctor.          levothyroxine  50 MCG tablet Commonly known as: SYNTHROID  Take 1 tablet (50 mcg total) by mouth daily.   ofloxacin  0.3 % OTIC solution Commonly known as: FLOXIN  Place 5 drops into the right ear 2 (two) times daily.          REVIEW OF SYSTEMS: A comprehensive ROS was conducted with the patient and is negative except as per HPI     OBJECTIVE:  VS: BP 124/84   Pulse 76   Ht 5' 4 (1.626 m)   Wt 189 lb (85.7 kg)   LMP 04/11/2012   SpO2 99%   BMI 32.44 kg/m    Wt Readings from Last 3 Encounters:  12/16/24 189 lb (85.7 kg)  12/28/23 189 lb (85.7 kg)  06/27/23 182 lb 9.6 oz (82.8 kg)     EXAM: General: Pt appears well and is in NAD  Eyes: External eye exam normal without stare, lid lag or exophthalmos.  EOM intact.    Neck: General: Supple without adenopathy. Thyroid : Thyroid  size normal.  No goiter or nodules appreciated.  Lungs: Clear with good BS bilat  Heart: Auscultation: RRR.  Abdomen: Soft, nontender  Extremities:  BL LE: No pretibial edema   Mental Status: Judgment, insight: Intact Orientation: Oriented to time, place, and person Mood and affect: No depression, anxiety, or agitation     DATA REVIEWED:   Latest Reference Range & Units 12/28/23 14:24  TSH 0.40 - 4.50 mIU/L 1.54  T4,Free(Direct) 0.8 - 1.8 ng/dL 1.2    ASSESSMENT/PLAN/RECOMMENDATIONS:    Hypothyroidism:  -Patient is clinically euthyroid -No local neck symptoms  - Pt educated extensively on the correct way to take levothyroxine  (first thing in the morning with water, 30 minutes before eating or taking other medications). - Pt encouraged to double dose the following day if she were to miss a dose given long half-life of levothyroxine . - TFT's remain  within normal range, no change  Medications :  Continue levothyroxine  50 mcg daily   Follow-up in 1 yr   Signed electronically by: Stefano Redgie Butts, MD  [facility]  [facility] [address] [place] [place], [place] [address] Phone: [phone] FAX: [phone]   CC: Elliot Charm (Inactive) No address on file Phone: None Fax: None   Return to Endocrinology clinic as below: Future Appointments  Date Time Provider Department Center  12/16/2024 11:30 AM Kashawn Manzano, Donell Redgie, MD [facility] None         "

## 2024-12-16 NOTE — Patient Instructions (Signed)

## 2024-12-17 ENCOUNTER — Ambulatory Visit: Payer: Self-pay | Admitting: Internal Medicine

## 2024-12-17 LAB — TSH: TSH: 1.98 m[IU]/L (ref 0.40–4.50)

## 2024-12-17 LAB — T4, FREE: Free T4: 1.2 ng/dL (ref 0.8–1.8)

## 2024-12-17 MED ORDER — LEVOTHYROXINE SODIUM 50 MCG PO TABS: 50.0000 ug | ORAL_TABLET | Freq: Every day | ORAL | 3 refills | Status: AC

## 2024-12-29 ENCOUNTER — Ambulatory Visit: Payer: BC Managed Care – PPO | Admitting: Internal Medicine

## 2025-12-16 ENCOUNTER — Ambulatory Visit: Admitting: Internal Medicine
